# Patient Record
Sex: Female | Born: 1960
Health system: Southern US, Community
[De-identification: ages and names within clinical notes are randomized; demographics above are authoritative.]

## PROBLEM LIST (undated history)

## (undated) DIAGNOSIS — I2510 Atherosclerotic heart disease of native coronary artery without angina pectoris: Secondary | ICD-10-CM

## (undated) DIAGNOSIS — N83209 Unspecified ovarian cyst, unspecified side: Secondary | ICD-10-CM

## (undated) DIAGNOSIS — K449 Diaphragmatic hernia without obstruction or gangrene: Secondary | ICD-10-CM

## (undated) HISTORY — PX: TUBAL LIGATION: SHX77

## (undated) HISTORY — PX: THYROID SURGERY: SHX805

## (undated) HISTORY — PX: CHOLECYSTECTOMY: SHX55

---

## 1987-03-31 HISTORY — PX: CYSTOSCOPY WITH STENT PLACEMENT: SHX5790

## 1997-09-21 ENCOUNTER — Ambulatory Visit (HOSPITAL_COMMUNITY): Admission: RE | Admit: 1997-09-21 | Discharge: 1997-09-21 | Payer: Self-pay | Admitting: Surgery

## 1997-09-28 ENCOUNTER — Ambulatory Visit (HOSPITAL_COMMUNITY): Admission: RE | Admit: 1997-09-28 | Discharge: 1997-09-28 | Payer: Self-pay | Admitting: Surgery

## 1997-10-11 ENCOUNTER — Observation Stay (HOSPITAL_COMMUNITY): Admission: RE | Admit: 1997-10-11 | Discharge: 1997-10-12 | Payer: Self-pay | Admitting: Surgery

## 1999-06-02 ENCOUNTER — Other Ambulatory Visit: Admission: RE | Admit: 1999-06-02 | Discharge: 1999-06-02 | Payer: Self-pay | Admitting: Obstetrics and Gynecology

## 1999-12-03 ENCOUNTER — Other Ambulatory Visit: Admission: RE | Admit: 1999-12-03 | Discharge: 1999-12-03 | Payer: Self-pay | Admitting: Obstetrics and Gynecology

## 2000-07-22 ENCOUNTER — Other Ambulatory Visit: Admission: RE | Admit: 2000-07-22 | Discharge: 2000-07-22 | Payer: Self-pay | Admitting: Obstetrics and Gynecology

## 2001-06-20 ENCOUNTER — Encounter: Payer: Self-pay | Admitting: Obstetrics and Gynecology

## 2001-06-20 ENCOUNTER — Ambulatory Visit (HOSPITAL_COMMUNITY): Admission: RE | Admit: 2001-06-20 | Discharge: 2001-06-20 | Payer: Self-pay | Admitting: Obstetrics and Gynecology

## 2001-08-02 ENCOUNTER — Other Ambulatory Visit: Admission: RE | Admit: 2001-08-02 | Discharge: 2001-08-02 | Payer: Self-pay | Admitting: Obstetrics and Gynecology

## 2002-07-03 ENCOUNTER — Encounter: Payer: Self-pay | Admitting: Obstetrics and Gynecology

## 2002-07-03 ENCOUNTER — Ambulatory Visit (HOSPITAL_COMMUNITY): Admission: RE | Admit: 2002-07-03 | Discharge: 2002-07-03 | Payer: Self-pay | Admitting: Obstetrics and Gynecology

## 2002-09-05 ENCOUNTER — Other Ambulatory Visit: Admission: RE | Admit: 2002-09-05 | Discharge: 2002-09-05 | Payer: Self-pay | Admitting: Obstetrics and Gynecology

## 2003-07-27 ENCOUNTER — Ambulatory Visit (HOSPITAL_COMMUNITY): Admission: RE | Admit: 2003-07-27 | Discharge: 2003-07-27 | Payer: Self-pay | Admitting: Obstetrics and Gynecology

## 2003-10-05 ENCOUNTER — Other Ambulatory Visit: Admission: RE | Admit: 2003-10-05 | Discharge: 2003-10-05 | Payer: Self-pay | Admitting: Obstetrics and Gynecology

## 2004-03-07 ENCOUNTER — Ambulatory Visit (HOSPITAL_COMMUNITY): Admission: RE | Admit: 2004-03-07 | Discharge: 2004-03-07 | Payer: Self-pay | Admitting: Diagnostic Radiology

## 2004-04-07 ENCOUNTER — Other Ambulatory Visit: Admission: RE | Admit: 2004-04-07 | Discharge: 2004-04-07 | Payer: Self-pay | Admitting: Obstetrics and Gynecology

## 2004-12-11 ENCOUNTER — Other Ambulatory Visit: Admission: RE | Admit: 2004-12-11 | Discharge: 2004-12-11 | Payer: Self-pay | Admitting: Obstetrics and Gynecology

## 2004-12-25 ENCOUNTER — Ambulatory Visit (HOSPITAL_COMMUNITY): Admission: RE | Admit: 2004-12-25 | Discharge: 2004-12-25 | Payer: Self-pay | Admitting: Obstetrics and Gynecology

## 2004-12-29 ENCOUNTER — Encounter: Admission: RE | Admit: 2004-12-29 | Discharge: 2004-12-29 | Payer: Self-pay | Admitting: Obstetrics and Gynecology

## 2004-12-30 ENCOUNTER — Encounter: Admission: RE | Admit: 2004-12-30 | Discharge: 2004-12-30 | Payer: Self-pay | Admitting: Obstetrics and Gynecology

## 2005-03-12 ENCOUNTER — Emergency Department (HOSPITAL_COMMUNITY): Admission: EM | Admit: 2005-03-12 | Discharge: 2005-03-12 | Payer: Self-pay | Admitting: Emergency Medicine

## 2005-07-16 ENCOUNTER — Encounter: Admission: RE | Admit: 2005-07-16 | Discharge: 2005-07-16 | Payer: Self-pay | Admitting: Obstetrics and Gynecology

## 2006-01-07 ENCOUNTER — Encounter: Admission: RE | Admit: 2006-01-07 | Discharge: 2006-01-07 | Payer: Self-pay | Admitting: Obstetrics and Gynecology

## 2007-01-10 ENCOUNTER — Encounter: Admission: RE | Admit: 2007-01-10 | Discharge: 2007-01-10 | Payer: Self-pay | Admitting: Obstetrics and Gynecology

## 2008-01-11 ENCOUNTER — Ambulatory Visit (HOSPITAL_COMMUNITY): Admission: RE | Admit: 2008-01-11 | Discharge: 2008-01-11 | Payer: Self-pay | Admitting: Obstetrics and Gynecology

## 2009-01-11 ENCOUNTER — Ambulatory Visit (HOSPITAL_COMMUNITY): Admission: RE | Admit: 2009-01-11 | Discharge: 2009-01-11 | Payer: Self-pay | Admitting: Obstetrics and Gynecology

## 2009-11-12 ENCOUNTER — Encounter: Admission: RE | Admit: 2009-11-12 | Discharge: 2009-12-25 | Payer: Self-pay | Admitting: Orthopedic Surgery

## 2010-01-13 ENCOUNTER — Ambulatory Visit (HOSPITAL_COMMUNITY): Admission: RE | Admit: 2010-01-13 | Discharge: 2010-01-13 | Payer: Self-pay | Admitting: Obstetrics and Gynecology

## 2011-01-01 ENCOUNTER — Other Ambulatory Visit (HOSPITAL_COMMUNITY): Payer: Self-pay | Admitting: Obstetrics and Gynecology

## 2011-01-01 DIAGNOSIS — Z139 Encounter for screening, unspecified: Secondary | ICD-10-CM

## 2011-01-15 ENCOUNTER — Emergency Department (HOSPITAL_COMMUNITY): Payer: 59

## 2011-01-15 ENCOUNTER — Other Ambulatory Visit: Payer: Self-pay

## 2011-01-15 ENCOUNTER — Observation Stay (HOSPITAL_COMMUNITY)
Admission: EM | Admit: 2011-01-15 | Discharge: 2011-01-16 | Disposition: A | Discharge status: Home / Self Care | Payer: 59 | Attending: Internal Medicine | Admitting: Internal Medicine

## 2011-01-15 DIAGNOSIS — R079 Chest pain, unspecified: Secondary | ICD-10-CM

## 2011-01-15 DIAGNOSIS — I498 Other specified cardiac arrhythmias: Secondary | ICD-10-CM | POA: Insufficient documentation

## 2011-01-15 DIAGNOSIS — R112 Nausea with vomiting, unspecified: Secondary | ICD-10-CM | POA: Insufficient documentation

## 2011-01-15 DIAGNOSIS — R0789 Other chest pain: Principal | ICD-10-CM | POA: Insufficient documentation

## 2011-01-15 DIAGNOSIS — R1110 Vomiting, unspecified: Secondary | ICD-10-CM

## 2011-01-15 DIAGNOSIS — K219 Gastro-esophageal reflux disease without esophagitis: Secondary | ICD-10-CM | POA: Insufficient documentation

## 2011-01-15 DIAGNOSIS — R12 Heartburn: Secondary | ICD-10-CM

## 2011-01-15 DIAGNOSIS — R001 Bradycardia, unspecified: Secondary | ICD-10-CM | POA: Diagnosis present

## 2011-01-15 DIAGNOSIS — E876 Hypokalemia: Secondary | ICD-10-CM | POA: Diagnosis present

## 2011-01-15 LAB — BASIC METABOLIC PANEL
BUN: 17 mg/dL (ref 6–23)
CO2: 27 mEq/L (ref 19–32)
Calcium: 8.9 mg/dL (ref 8.4–10.5)
Chloride: 104 mEq/L (ref 96–112)
Creatinine, Ser: 0.68 mg/dL (ref 0.50–1.10)
GFR calc Af Amer: >90 mL/min (ref >90)
GFR calc non Af Amer: >90 mL/min (ref >90)
Glucose, Bld: 154 mg/dL — ABNORMAL HIGH (ref 70–99)
Potassium: 3.3 mEq/L — ABNORMAL LOW (ref 3.5–5.1)
Sodium: 141 mEq/L (ref 135–145)

## 2011-01-15 LAB — CBC
HCT: 36 % (ref 36.0–46.0)
Hemoglobin: 12.1 g/dL (ref 12.0–15.0)
MCH: 30.5 pg (ref 26.0–34.0)
MCHC: 33.6 g/dL (ref 30.0–36.0)
MCV: 90.7 fL (ref 78.0–100.0)
Platelets: 263 10*3/uL (ref 150–400)
RBC: 3.97 MIL/uL (ref 3.87–5.11)
RDW: 12 % (ref 11.5–15.5)
WBC: 8.2 10*3/uL (ref 4.0–10.5)

## 2011-01-15 LAB — CARDIAC PANEL(CRET KIN+CKTOT+MB+TROPI)
CK, MB: 2.2 ng/mL (ref 0.3–4.0)
Relative Index: INVALID (ref 0.0–2.5)
Total CK: 58 U/L (ref 7–177)
Troponin I: <0.3 ng/mL (ref <0.30)

## 2011-01-15 LAB — LIPASE, BLOOD: Lipase: 68 U/L — ABNORMAL HIGH (ref 11–59)

## 2011-01-15 LAB — POCT I-STAT TROPONIN I: Troponin i, poc: 0 ng/mL (ref 0.00–0.08)

## 2011-01-15 LAB — MAGNESIUM: Magnesium: 1.9 mg/dL (ref 1.5–2.5)

## 2011-01-15 MED ORDER — MORPHINE SULFATE 10 MG/ML IJ SOLN
INTRAMUSCULAR | Status: AC
  Administered 2011-01-15: 6 mg
  Filled 2011-01-15: qty 1

## 2011-01-15 MED ORDER — ONDANSETRON HCL 4 MG/2ML IJ SOLN: 4.0000 mg | Freq: Four times a day (QID) | INTRAMUSCULAR | Status: DC | PRN

## 2011-01-15 MED ORDER — POTASSIUM CHLORIDE IN NACL 40-0.9 MEQ/L-% IV SOLN
INTRAVENOUS | Status: AC
  Filled 2011-01-15: qty 1000

## 2011-01-15 MED ORDER — ACETAMINOPHEN 325 MG PO TABS: 650.0000 mg | ORAL_TABLET | Freq: Four times a day (QID) | ORAL | Status: DC | PRN

## 2011-01-15 MED ORDER — POTASSIUM CHLORIDE CRYS ER 20 MEQ PO TBCR
20.0000 meq | EXTENDED_RELEASE_TABLET | Freq: Once | ORAL | Status: AC
  Administered 2011-01-15: 20 meq via ORAL
  Filled 2011-01-15: qty 1

## 2011-01-15 MED ORDER — HYDROMORPHONE HCL 1 MG/ML IJ SOLN: 0.5000 mg | INTRAMUSCULAR | Status: DC | PRN

## 2011-01-15 MED ORDER — SODIUM CHLORIDE 0.9 % IJ SOLN
INTRAMUSCULAR | Status: AC
  Administered 2011-01-15: 21:00:00
  Filled 2011-01-15: qty 3

## 2011-01-15 MED ORDER — ONDANSETRON HCL 4 MG PO TABS: 4.0000 mg | ORAL_TABLET | Freq: Four times a day (QID) | ORAL | Status: DC | PRN

## 2011-01-15 MED ORDER — ENOXAPARIN SODIUM 40 MG/0.4ML ~~LOC~~ SOLN
40.0000 mg | SUBCUTANEOUS | Status: DC
  Administered 2011-01-15: 40 mg via SUBCUTANEOUS
  Filled 2011-01-15: qty 0.4

## 2011-01-15 MED ORDER — ONDANSETRON HCL 4 MG/2ML IJ SOLN
4.0000 mg | Freq: Once | INTRAMUSCULAR | Status: AC
  Administered 2011-01-15: 4 mg via INTRAVENOUS
  Filled 2011-01-15: qty 2

## 2011-01-15 MED ORDER — ACETAMINOPHEN 650 MG RE SUPP: 650.0000 mg | Freq: Four times a day (QID) | RECTAL | Status: DC | PRN

## 2011-01-15 MED ORDER — POTASSIUM CHLORIDE 10 MEQ/100ML IV SOLN
10.0000 meq | INTRAVENOUS | Status: AC
  Administered 2011-01-15 (×2): 10 meq via INTRAVENOUS
  Filled 2011-01-15: qty 100

## 2011-01-15 MED ORDER — ALUM & MAG HYDROXIDE-SIMETH 200-200-20 MG/5ML PO SUSP: 30.0000 mL | Freq: Four times a day (QID) | ORAL | Status: DC | PRN

## 2011-01-15 MED ORDER — PANTOPRAZOLE SODIUM 40 MG IV SOLR
40.0000 mg | Freq: Two times a day (BID) | INTRAVENOUS | Status: DC
  Administered 2011-01-15 – 2011-01-16 (×2): 40 mg via INTRAVENOUS
  Filled 2011-01-15 (×2): qty 40

## 2011-01-15 MED ORDER — SODIUM CHLORIDE 0.9 % IJ SOLN
INTRAMUSCULAR | Status: AC
  Administered 2011-01-15: 10 mL
  Filled 2011-01-15: qty 10

## 2011-01-15 MED ORDER — IOHEXOL 350 MG/ML SOLN
100.0000 mL | Freq: Once | INTRAVENOUS | Status: AC | PRN
  Administered 2011-01-15: 100 mL via INTRAVENOUS

## 2011-01-15 MED ORDER — POTASSIUM CHLORIDE IN NACL 40-0.9 MEQ/L-% IV SOLN
INTRAVENOUS | Status: DC
  Administered 2011-01-15 – 2011-01-16 (×2): via INTRAVENOUS
  Filled 2011-01-15 (×3): qty 1000

## 2011-01-15 MED ORDER — TRAZODONE HCL 50 MG PO TABS: 50.0000 mg | ORAL_TABLET | Freq: Every evening | ORAL | Status: DC | PRN

## 2011-01-15 NOTE — ED Notes (Signed)
Pt reports was working 3rd shift last night and started having pain in chest.  Says felt like heart burn.  Reports pain went away.  Pt woke up around 1130 this am with sqeezing pain in center of chest radiating into back.  Took 324mg  chewable asa and called ems.  Pt had 3 sprays of nitro and pain went from 10 to a 5.  Pain currently at 7.

## 2011-01-15 NOTE — ED Notes (Signed)
istat troponin initiated at this time.

## 2011-01-15 NOTE — H&P (Signed)
Hospital Admission Note Date: 01/15/2011  Patient name: Heather Arnold Medical record number: [id number] Date of birth: 10/26/1960 Age: 50 y.o. Gender: female PCP: Colette Ribas, MD  Attending physician: Christiane Ha  Chief Complaint: Chest pain.  History of Present Illness: Heather Arnold is an 50 y.o. female who presents with sudden severe substernal chest squeezing. It radiated to her back. It was unrelieved by nitroglycerin. Morphine helped, but now it feels just like a "ache". She had heartburn last night at work. This morning she felt nauseated. She is still nauseated. She had an episode of emesis after receiving morphine. The pain was not positional. She was doubled over in pain according to ED physician when she arrived. She has no past medical history. She had no other associated symptoms. She has never had a cardiac workup. Her EKG and cardiac enzymes were normal. She had a CT angiogram of the chest that was unremarkable.  Meds: None  Allergies: Review of patient's allergies indicates no known allergies.  Social history: Patient does not drink smoke or use drugs. 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 She is here with her significant other.  Family history: Patient's father had an MI at age 86. He also has diabetes and kidney cancer  Past Surgical History  Procedure Date  . Cholecystectomy   . Thyroid surgery    Review of Systems: Systems reviewed. As above otherwise negative.  Physical Exam: Blood pressure 125/69, pulse 66, temperature 97.6 F (36.4 C), temperature source Oral, resp. rate 18, height 5\' 2"  (1.575 m), weight 49.896 kg (110 lb), SpO2 100.00%. BP 125/69  Pulse 66  Temp(Src) 97.6 F (36.4 C) (Oral)  Resp 18  Ht 5\' 2"  (1.575 m)  Wt 49.896 kg (110 lb)  BMI 20.12 kg/m2  SpO2 100%  General Appearance:    Alert, cooperative, grasping emesis bag   Head:    Normocephalic, without obvious abnormality, atraumatic  Eyes:    PERRL,  conjunctiva/corneas clear, EOM's intact, fundi    benign, both eyes     Nose:   Nares normal, septum midline, mucosa normal, no drainage    or sinus tenderness  Throat:   slightly dry mucous membranes   Neck:   Supple, symmetrical, trachea midline, no adenopathy;    thyroid:  no enlargement/tenderness/nodules; no carotid   bruit or JVD  Back:     Symmetric, no curvature, ROM normal, no CVA tenderness  Lungs:     Clear to auscultation bilaterally, respirations unlabored  Chest Wall:    No tenderness or deformity   Heart:    Regular rate and rhythm, S1 and S2 normal, no murmur, rub   or gallop  Breast Exam:    No tenderness, masses, or nipple abnormality  Abdomen:     Soft, non-tender, bowel sounds active all four quadrants,    no masses, no organomegaly  Genitalia:   deferred   Rectal:   deferred   Extremities:   Extremities normal, atraumatic, no cyanosis or edema  Pulses:   2+ and symmetric all extremities  Skin:   Skin color, texture, turgor normal, no rashes or lesions  Lymph nodes:   Cervical, supraclavicular, and axillary nodes normal  Neurologic:   CNII-XII intact, normal strength, sensation and reflexes    throughout    Psychiatric: Normal affect  Lab results: Basic Metabolic Panel:  Basename 01/15/11 1245  NA 141  K 3.3*  CL 104  CO2 27  GLUCOSE 154*  BUN 17  CREATININE  0.68  CALCIUM 8.9  MG --  PHOS --   Liver Function Tests: No results found for this basename: AST:2,ALT:2,ALKPHOS:2,BILITOT:2,PROT:2,ALBUMIN:2 in the last 72 hours No results found for this basename: LIPASE:2,AMYLASE:2 in the last 72 hours No results found for this basename: AMMONIA:2 in the last 72 hours CBC:  Basename 01/15/11 1245  WBC 8.2  NEUTROABS --  HGB 12.1  HCT 36.0  MCV 90.7  PLT 263   Cardiac Enzymes:  Basename 01/15/11 1249  CKTOTAL 58  CKMB 2.2  CKMBINDEX --  TROPONINI <0.30   BNP: No results found for this basename: POCBNP:3 in the last 72 hours D-Dimer: No  results found for this basename: DDIMER:2 in the last 72 hours CBG: No results found for this basename: GLUCAP:6 in the last 72 hours Hemoglobin A1C: No results found for this basename: HGBA1C in the last 72 hours Fasting Lipid Panel: No results found for this basename: CHOL,HDL,LDLCALC,TRIG,CHOLHDL,LDLDIRECT in the last 72 hours Thyroid Function Tests: No results found for this basename: TSH,T4TOTAL,FREET4,T3FREE,THYROIDAB in the last 72 hours Anemia Panel: No results found for this basename: VITAMINB12,FOLATE,FERRITIN,TIBC,IRON,RETICCTPCT in the last 72 hours  Imaging results:  Ct Angio Chest W/cm &/or Wo Cm  01/15/2011  *RADIOLOGY REPORT*  Clinical Data:  Chest pain.  CT ANGIOGRAPHY CHEST WITH CONTRAST  Technique:  Multidetector CT imaging of the chest was performed using the standard protocol during bolus administration of intravenous contrast.  Multiplanar CT image reconstructions including MIPs were obtained to evaluate the vascular anatomy.  Contrast: OMNIPAQUE IOHEXOL 350 MG/ML IV SOLN  Comparison:  None  Findings:  The chest wall is unremarkable.  A small scattered supraclavicular lymph nodes but no adenopathy.  No axillary adenopathy.  The only the left thyroid lobe is identified. Probable prior right thyroidectomy.  The bony thorax is intact.  No destructive bony lesions or spinal canal compromise.  The heart is normal in size.  No pericardial effusion.  No mediastinal or hilar lymphadenopathy.  The esophagus is grossly normal.  There is a small to moderate sized hiatal hernia noted. The aorta is normal in caliber.  No dissection.  The major branch vessels are patent.  The pulmonary arterial tree is well opacified.  No filling defects to suggest pulmonary emboli.  Examination of the lung parenchyma demonstrates no acute pulmonary findings.  No pleural effusion.  No worrisome pulmonary nodules or masses.  The tracheobronchial tree is unremarkable.  The upper abdomen is unremarkable.   Review of the MIP images confirms the above findings.  IMPRESSION:  1.  No CT findings for pulmonary embolism. 2.  Normal thoracic aorta. 3.  No acute pulmonary findings.  Original Report Authenticated By: P. Loralie Champagne, M.D.   Dg Chest Portable 1 View  01/15/2011  *RADIOLOGY REPORT*  Clinical Data: Chest pain  PORTABLE CHEST - 1 VIEW  Comparison: None  Findings: Normal heart size and normal vascularity.  Mild apical scarring bilaterally.  Negative for infiltrate effusion or mass lesion.  IMPRESSION: No active cardiopulmonary disease.  Original Report Authenticated By: Camelia Phenes, M.D.    Assessment & Plan: Principal Problem:  *Chest pain Active Problems:  Vomiting  Heartburn  hypokalemia  Patient's chest pain is most likely GI etiology. She is still nauseated. I will monitor her overnight. Give IV protonic, anti-emetics, IV fluids. Rule out MI. Replete potassium. If improved tomorrow, she can likely be discharged.  Donielle Kaigler L 01/15/2011, 4:25 PM

## 2011-01-15 NOTE — ED Provider Notes (Signed)
History   This chart was scribed for Raeford Razor, MD by Clarita Crane. The patient was seen in room APA19/APA19 and the patient's care was started at 12:43PM.   CSN: [id number] Arrival date & time: 01/15/2011 12:38 PM   First MD Initiated Contact with Patient 01/15/11 1247      Chief Complaint  Patient presents with  . Chest Pain   HPI Heather Arnold is a 50 y.o. female who presents to the Emergency Department complaining of moderate to severe chest pain described as aching which began last night while working with 1 episode lasting 3-4 hours before resolving and an additional reoccurrence of same chest pain which began 2 hours ago after awaking and has been constant and gradually worsening since with associated SOB, nausea, vomiting and back pain. Notes chest pain is aggravated by nothing and was relieved with administration of 3 NTG sprays provided by EMS en route. Denies fever.  History reviewed. No pertinent past medical history.  Past Surgical History  Procedure Date  . Cholecystectomy   . Thyroid surgery     No family history on file.  History  Substance Use Topics  . Smoking status: Not on file  . Smokeless tobacco: Not on file  . Alcohol Use:     OB History    Grav Para Term Preterm Abortions TAB SAB Ect Mult Living                  Review of Systems 10 Systems reviewed and are negative for acute change except as noted in the HPI.  Allergies  Review of patient's allergies indicates no known allergies.  Home Medications  No current outpatient prescriptions on file.  BP 123/55  Pulse 63  Temp(Src) 97.6 F (36.4 C) (Oral)  Resp 16  Ht 5\' 2"  (1.575 m)  Wt 110 lb (49.896 kg)  BMI 20.12 kg/m2  SpO2 97%  Physical Exam  Nursing note and vitals reviewed. Constitutional: She is oriented to person, place, and time. She appears well-developed and well-nourished.       Uncomfortable appearing. Actively vomiting during exam.   HENT:  Head: Normocephalic and  atraumatic.  Eyes: EOM are normal. Pupils are equal, round, and reactive to light.  Neck: Neck supple. No tracheal deviation present.  Cardiovascular: Regular rhythm.  Tachycardia present.   No murmur heard. Pulmonary/Chest: Effort normal. No respiratory distress. She has no wheezes. She has no rales.  Abdominal: Soft. She exhibits no distension. There is no tenderness.  Musculoskeletal: Normal range of motion. She exhibits no edema.  Neurological: She is alert and oriented to person, place, and time. No sensory deficit.  Skin: Skin is warm and dry.  Psychiatric: Her behavior is normal.    ED Course  Procedures (including critical care time)  DIAGNOSTIC STUDIES:   COORDINATION OF CARE:    Labs Reviewed  BASIC METABOLIC PANEL - Abnormal; Notable for the following:    Potassium 3.3 (*)    Glucose, Bld 154 (*)    All other components within normal limits  CBC  CARDIAC PANEL(CRET KIN+CKTOT+MB+TROPI)  POCT I-STAT TROPONIN I  I-STAT TROPONIN I   Ct Angio Chest W/cm &/or Wo Cm  01/15/2011  *RADIOLOGY REPORT*  Clinical Data:  Chest pain.  CT ANGIOGRAPHY CHEST WITH CONTRAST  Technique:  Multidetector CT imaging of the chest was performed using the standard protocol during bolus administration of intravenous contrast.  Multiplanar CT image reconstructions including MIPs were obtained to evaluate the vascular anatomy.  Contrast:  OMNIPAQUE IOHEXOL 350 MG/ML IV SOLN  Comparison:  None  Findings:  The chest wall is unremarkable.  A small scattered supraclavicular lymph nodes but no adenopathy.  No axillary adenopathy.  The only the left thyroid lobe is identified. Probable prior right thyroidectomy.  The bony thorax is intact.  No destructive bony lesions or spinal canal compromise.  The heart is normal in size.  No pericardial effusion.  No mediastinal or hilar lymphadenopathy.  The esophagus is grossly normal.  There is a small to moderate sized hiatal hernia noted. The aorta is normal in  caliber.  No dissection.  The major branch vessels are patent.  The pulmonary arterial tree is well opacified.  No filling defects to suggest pulmonary emboli.  Examination of the lung parenchyma demonstrates no acute pulmonary findings.  No pleural effusion.  No worrisome pulmonary nodules or masses.  The tracheobronchial tree is unremarkable.  The upper abdomen is unremarkable.  Review of the MIP images confirms the above findings.  IMPRESSION:  1.  No CT findings for pulmonary embolism. 2.  Normal thoracic aorta. 3.  No acute pulmonary findings.  Original Report Authenticated By: P. Loralie Champagne, M.D.   Dg Chest Portable 1 View  01/15/2011  *RADIOLOGY REPORT*  Clinical Data: Chest pain  PORTABLE CHEST - 1 VIEW  Comparison: None  Findings: Normal heart size and normal vascularity.  Mild apical scarring bilaterally.  Negative for infiltrate effusion or mass lesion.  IMPRESSION: No active cardiopulmonary disease.  Original Report Authenticated By: Camelia Phenes, M.D.   EKG:  Rhythm:normal sinus Rate: 72 Axis: normal Intervals:normal ST segments: Normal Comparison: none  1. Chest pain   2. Hypokalemia   3. Vomiting   4. Heartburn       MDM  50yF with CP. Initial w/u relatively unremarkable.  Symptoms concerning though, particularly repeat episodes and relief with nitro. Do not have convincing alternative diagnosis at this time. Pt does not many significant risk factors for ACS aside from age, but given repeat episodes which could be unstable angina will admit for further eval.     I personally preformed the services scribed in my presence. The recorded information has been reviewed and considered. Raeford Razor, MD.    Raeford Razor, MD 01/20/11 [phone]

## 2011-01-15 NOTE — ED Notes (Signed)
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  Reports was working 3rd shift last night and started having an achy feeling in chest.  Says thought was indigestion.  Reports pain got better.  Pt went to sleep and woke up around 1130 this am with severe squeezing pain in center of chest radiating through to her back.  Pt nauseated, dry heaving.  Unable to sit still upon arrival to ED.  EDP was notified and morphine administered.  Pt more comforatble at this time.  ECG was performed and showed to edp.

## 2011-01-16 ENCOUNTER — Encounter (HOSPITAL_COMMUNITY): Payer: Self-pay

## 2011-01-16 DIAGNOSIS — R001 Bradycardia, unspecified: Secondary | ICD-10-CM | POA: Diagnosis present

## 2011-01-16 DIAGNOSIS — E876 Hypokalemia: Secondary | ICD-10-CM | POA: Diagnosis present

## 2011-01-16 LAB — BASIC METABOLIC PANEL
BUN: 11 mg/dL (ref 6–23)
GFR calc Af Amer: >90 mL/min (ref >90)
GFR calc non Af Amer: >90 mL/min (ref >90)
Potassium: 4 mEq/L (ref 3.5–5.1)

## 2011-01-16 LAB — CARDIAC PANEL(CRET KIN+CKTOT+MB+TROPI)
CK, MB: 1.9 ng/mL (ref 0.3–4.0)
CK, MB: 2.1 ng/mL (ref 0.3–4.0)
Troponin I: <0.3 ng/mL (ref <0.30)

## 2011-01-16 MED ORDER — ACETAMINOPHEN 325 MG PO TABS: 650.0000 mg | ORAL_TABLET | Freq: Four times a day (QID) | ORAL | Status: AC | PRN

## 2011-01-16 MED ORDER — PANTOPRAZOLE SODIUM 40 MG PO TBEC: 40.0000 mg | DELAYED_RELEASE_TABLET | Freq: Every day | ORAL | Status: DC

## 2011-01-16 MED ORDER — ONDANSETRON HCL 4 MG PO TABS: 4.0000 mg | ORAL_TABLET | Freq: Four times a day (QID) | ORAL | Status: AC | PRN

## 2011-01-16 MED ORDER — SODIUM CHLORIDE 0.9 % IJ SOLN
INTRAMUSCULAR | Status: AC
  Administered 2011-01-16: 10 mL
  Filled 2011-01-16: qty 10

## 2011-01-16 NOTE — Discharge Summary (Signed)
Physician Discharge Summary  HAFSAH HENDLER MRN: [id number] DOB/AGE: [birth year] 50 y.o.  PCP: Colette Ribas, MD   Admit date: 01/15/2011 Discharge date: 01/16/2011  Discharge Diagnoses:  1. Chest pain, possibly secondary to GERD. MI was ruled out. 2. Nausea and vomiting, secondary to GERD, resolved. 3. Transient bradycardia. 4. Hypokalemia.    Current Discharge Medication List    START taking these medications   Details  acetaminophen (TYLENOL) 325 MG tablet Take 2 tablets (650 mg total) by mouth every 6 (six) hours as needed (or Fever >/= 101). Qty: 30 tablet    ondansetron (ZOFRAN) 4 MG tablet Take 1 tablet (4 mg total) by mouth every 6 (six) hours as needed for nausea. Qty: 20 tablet, Refills: 0    pantoprazole (PROTONIX) 40 MG tablet Take 1 tablet (40 mg total) by mouth daily. Qty: 30 tablet, Refills: 2        Discharge Condition: Improved.  Disposition: Home.    Consults: None   Significant Diagnostic Studies: Ct Angio Chest W/cm &/or Wo Cm  01/15/2011  *RADIOLOGY REPORT*  Clinical Data:  Chest pain.  CT ANGIOGRAPHY CHEST WITH CONTRAST  Technique:  Multidetector CT imaging of the chest was performed using the standard protocol during bolus administration of intravenous contrast.  Multiplanar CT image reconstructions including MIPs were obtained to evaluate the vascular anatomy.  Contrast: OMNIPAQUE IOHEXOL 350 MG/ML IV SOLN  Comparison:  None  Findings:  The chest wall is unremarkable.  A small scattered supraclavicular lymph nodes but no adenopathy.  No axillary adenopathy.  The only the left thyroid lobe is identified. Probable prior right thyroidectomy.  The bony thorax is intact.  No destructive bony lesions or spinal canal compromise.  The heart is normal in size.  No pericardial effusion.  No mediastinal or hilar lymphadenopathy.  The esophagus is grossly normal.  There is a small to moderate sized hiatal hernia noted. The aorta is normal in  caliber.  No dissection.  The major branch vessels are patent.  The pulmonary arterial tree is well opacified.  No filling defects to suggest pulmonary emboli.  Examination of the lung parenchyma demonstrates no acute pulmonary findings.  No pleural effusion.  No worrisome pulmonary nodules or masses.  The tracheobronchial tree is unremarkable.  The upper abdomen is unremarkable.  Review of the MIP images confirms the above findings.  IMPRESSION:  1.  No CT findings for pulmonary embolism. 2.  Normal thoracic aorta. 3.  No acute pulmonary findings.  Original Report Authenticated By: P. Loralie Champagne, M.D.   Dg Chest Portable 1 View  01/15/2011  *RADIOLOGY REPORT*  Clinical Data: Chest pain  PORTABLE CHEST - 1 VIEW  Comparison: None  Findings: Normal heart size and normal vascularity.  Mild apical scarring bilaterally.  Negative for infiltrate effusion or mass lesion.  IMPRESSION: No active cardiopulmonary disease.  Original Report Authenticated By: Camelia Phenes, M.D.      Microbiology: No results found for this or any previous visit (from the past 240 hour(s)).   Labs: Results for orders placed during the hospital encounter of 01/15/11 (from the past 48 hour(s))  CBC     Status: Normal   Collection Time   01/15/11 12:45 PM      Component Value Range Comment   WBC 8.2  4.0 - 10.5 (K/uL)    RBC 3.97  3.87 - 5.11 (MIL/uL)    Hemoglobin 12.1  12.0 - 15.0 (g/dL)    HCT 40.9  81.1 -  46.0 (%)    MCV 90.7  78.0 - 100.0 (fL)    MCH 30.5  26.0 - 34.0 (pg)    MCHC 33.6  30.0 - 36.0 (g/dL)    RDW 16.1  09.6 - 04.5 (%)    Platelets 263  150 - 400 (K/uL)   BASIC METABOLIC PANEL     Status: Abnormal   Collection Time   01/15/11 12:45 PM      Component Value Range Comment   Sodium 141  135 - 145 (mEq/L)    Potassium 3.3 (*) 3.5 - 5.1 (mEq/L)    Chloride 104  96 - 112 (mEq/L)    CO2 27  19 - 32 (mEq/L)    Glucose, Bld 154 (*) 70 - 99 (mg/dL)    BUN 17  6 - 23 (mg/dL)    Creatinine, Ser 4.09   0.50 - 1.10 (mg/dL)    Calcium 8.9  8.4 - 10.5 (mg/dL)    GFR calc non Af Amer >90  >90 (mL/min)    GFR calc Af Amer >90  >90 (mL/min)   MAGNESIUM     Status: Normal   Collection Time   01/15/11 12:45 PM      Component Value Range Comment   Magnesium 1.9  1.5 - 2.5 (mg/dL)   LIPASE, BLOOD     Status: Abnormal   Collection Time   01/15/11 12:45 PM      Component Value Range Comment   Lipase 68 (*) 11 - 59 (U/L)   CARDIAC PANEL(CRET KIN+CKTOT+MB+TROPI)     Status: Normal   Collection Time   01/15/11 12:49 PM      Component Value Range Comment   Total CK 58  7 - 177 (U/L)    CK, MB 2.2  0.3 - 4.0 (ng/mL)    Troponin I <0.30  <0.30 (ng/mL)    Relative Index RELATIVE INDEX IS INVALID  0.0 - 2.5    POCT I-STAT TROPONIN I     Status: Normal   Collection Time   01/15/11 12:51 PM      Component Value Range Comment   Troponin i, poc 0.00  0.00 - 0.08 (ng/mL)    Comment 3            CARDIAC PANEL(CRET KIN+CKTOT+MB+TROPI)     Status: Normal   Collection Time   01/15/11 11:38 PM      Component Value Range Comment   Total CK 45  7 - 177 (U/L)    CK, MB 2.1  0.3 - 4.0 (ng/mL)    Troponin I <0.30  <0.30 (ng/mL)    Relative Index RELATIVE INDEX IS INVALID  0.0 - 2.5    BASIC METABOLIC PANEL     Status: Abnormal   Collection Time   01/16/11  6:00 AM      Component Value Range Comment   Sodium 138  135 - 145 (mEq/L)    Potassium 4.0  3.5 - 5.1 (mEq/L) DELTA CHECK NOTED   Chloride 105  96 - 112 (mEq/L)    CO2 26  19 - 32 (mEq/L)    Glucose, Bld 102 (*) 70 - 99 (mg/dL)    BUN 11  6 - 23 (mg/dL)    Creatinine, Ser 8.11  0.50 - 1.10 (mg/dL)    Calcium 8.8  8.4 - 10.5 (mg/dL)    GFR calc non Af Amer >90  >90 (mL/min)    GFR calc Af Amer >90  >90 (mL/min)   CARDIAC PANEL(CRET  KIN+CKTOT+MB+TROPI)     Status: Normal   Collection Time   01/16/11  6:00 AM      Component Value Range Comment   Total CK 40  7 - 177 (U/L)    CK, MB 1.9  0.3 - 4.0 (ng/mL)    Troponin I <0.30  <0.30 (ng/mL)     Relative Index RELATIVE INDEX IS INVALID  0.0 - 2.5       HPI : The patient is a 50 year old woman with no significant past medical history, who presented to the emergency department on 01/15/2011 with a chief complaint of severe substernal chest pain, she described as squeezing. It was unrelieved by nitroglycerin. Morphine did help to relieve the pain, however, she vomited after being given morphine. During the initial evaluation, her EKG and initial cardiac enzymes were noted to be within normal limits. A CT angiogram of her chest was ordered and it revealed no PE and virtually no abnormalities. She was admitted for observation and evaluation.  HOSPITAL COURSE: The patient was started on supportive treatment. There was a high suspicion for gastroesophageal reflux or esophagitis. Therefore, IV Protonix was added IV twice a day. Zofran was ordered for nausea and vomiting. Potassium chloride was repleted in the IV fluids and orally. For further evaluation, cardiac enzymes were ordered. All of her cardiac enzymes were within normal limits, and therefore, she ruled out for myocardial infarction.  The next morning, following admission, she had no complaints of chest pain. Her serum potassium improved from 3.3-4.0. Given her symptomatic relief on Protonix, it was felt that her symptomatology was likely secondary to gastroesophageal reflux disease. The patient also reported some stress. 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 As much as possible, she was advised to avoid heavy lifting and stress. She was advised to avoid NSAIDs and use Tylenol as needed for pain. She was discharged on Protonix 40 mg daily.   Discharge Exam: Blood pressure 110/66, pulse 61, temperature 98.3 F (36.8 C), temperature source Oral, resp. rate 19, height 5\' 2"  (1.575 m), weight 51.211 kg (112 lb 14.4 oz), SpO2 96.00%. Lungs: Clear to  auscultation bilaterally. Heart: S1, S2, no murmurs rubs or gallops. Abdomen: All sounds, nontender, nondistended. Extremities: No pedal edema.    Discharge Orders    Future Appointments: Provider: Department: Dept Phone: Center:   01/19/2011 8:40 AM [facility] [phone] [facility]     Future Orders Please Complete By Expires   Diet - low sodium heart healthy      Diet general      Increase activity slowly      Discharge instructions      Comments:   Avoid stress and heavy lifting. Avoid NSAID medications. Take Protonix as prescribed. Call to follow up with Dr. Phillips Odor in 1-2 weeks.      Follow-up Information    Call GOLDING,JOHN CABOT. (Call to make an appointment to f/up in 1-2 weeks.)    Contact information:   [address] [place] [address] [place] [address] [phone]          Signed: Priyana Mccarey 01/16/2011, 10:02 AM

## 2011-01-16 NOTE — Progress Notes (Signed)
PIV removed without complaint, patient discharged home. Patient verbalizes understanding of discharge instructions, follow up appointments and prescriptions. Patient escorted out by staff, transported home by family.

## 2011-01-19 ENCOUNTER — Ambulatory Visit (HOSPITAL_COMMUNITY)
Admission: RE | Admit: 2011-01-19 | Discharge: 2011-01-19 | Disposition: A | Discharge status: Home / Self Care | Payer: 59 | Source: Ambulatory Visit | Attending: Obstetrics and Gynecology | Admitting: Obstetrics and Gynecology

## 2011-01-19 DIAGNOSIS — Z1231 Encounter for screening mammogram for malignant neoplasm of breast: Secondary | ICD-10-CM | POA: Insufficient documentation

## 2011-01-19 DIAGNOSIS — Z139 Encounter for screening, unspecified: Secondary | ICD-10-CM

## 2011-09-04 ENCOUNTER — Emergency Department (HOSPITAL_COMMUNITY)
Admission: EM | Admit: 2011-09-04 | Discharge: 2011-09-04 | Disposition: A | Discharge status: Home / Self Care | Payer: 59 | Attending: Emergency Medicine | Admitting: Emergency Medicine

## 2011-09-04 ENCOUNTER — Encounter (HOSPITAL_COMMUNITY): Payer: Self-pay | Admitting: *Deleted

## 2011-09-04 ENCOUNTER — Emergency Department (HOSPITAL_COMMUNITY): Payer: 59

## 2011-09-04 DIAGNOSIS — W010XXA Fall on same level from slipping, tripping and stumbling without subsequent striking against object, initial encounter: Secondary | ICD-10-CM | POA: Insufficient documentation

## 2011-09-04 DIAGNOSIS — S6390XA Sprain of unspecified part of unspecified wrist and hand, initial encounter: Secondary | ICD-10-CM | POA: Insufficient documentation

## 2011-09-04 DIAGNOSIS — S63601A Unspecified sprain of right thumb, initial encounter: Secondary | ICD-10-CM

## 2011-09-04 NOTE — ED Notes (Signed)
Pt was walking into her garage when her feet slipped and she fell landing on her right hand, pt c/o pain to right thumb area. Pt has swelling and bruising noted to right thumb area.

## 2011-09-04 NOTE — ED Notes (Signed)
Swelling and purple discoloration noted to right thumb. Pt slipped and fell and caught herself with her hand. Able to move thumb but causes pain. Alert and oriented x 3. Skin warm and dry. Color pink.

## 2011-09-04 NOTE — Discharge Instructions (Signed)
Finger Sprain A finger sprain is a tear in one of the strong, fibrous tissues that connect the bones (ligaments) in your finger. The severity of the sprain depends on how much of the ligament is torn. The tear can be either partial or complete. CAUSES  Often, sprains are a result of a fall or accident. If you extend your hands to catch an object or to protect yourself, the force of the impact causes the fibers of your ligament to stretch too much. This excess tension causes the fibers of your ligament to tear. SYMPTOMS  You may have some loss of motion in your finger. Other symptoms include:  Bruising.   Tenderness.   Swelling.  DIAGNOSIS  In order to diagnose finger sprain, your caregiver will physically examine your finger or thumb to determine how torn the ligament is. Your caregiver may also suggest an X-ray exam of your finger to make sure no bones are broken. TREATMENT  If your ligament is only partially torn, treatment usually involves keeping the finger in a fixed position (immobilization) for a short period. To do this, your caregiver will apply a bandage, cast, or splint to keep your finger from moving until it heals. For a partially torn ligament, the healing process usually takes 2 to 3 weeks. If your ligament is completely torn, you may need surgery to reconnect the ligament to the bone. After surgery a cast or splint will be applied and will need to stay on your finger or thumb for 4 to 6 weeks while your ligament heals. HOME CARE INSTRUCTIONS  Keep your injured finger elevated, when possible, to decrease swelling.   To ease pain and swelling, apply ice to your joint twice a day, for 2 to 3 days:   Put ice in a plastic bag.   Place a towel between your skin and the bag.   Leave the ice on for 15 minutes.   Only take over-the-counter or prescription medicine for pain as directed by your caregiver.   Do not wear rings on your injured finger.   Do not leave your finger  unprotected until pain and stiffness go away (usually 3 to 4 weeks).   Do not allow your cast or splint to get wet. Cover your cast or splint with a plastic bag when you shower or bathe. Do not swim.   Your caregiver may suggest special exercises for you to do during your recovery to prevent or limit permanent stiffness.  SEEK IMMEDIATE MEDICAL CARE IF:  Your cast or splint becomes damaged.   Your pain becomes worse rather than better.  MAKE SURE YOU:  Understand these instructions.   Will watch your condition.   Will get help right away if you are not doing well or get worse.  Document Released: 04/23/2004 Document Revised: 03/05/2011 Document Reviewed: 11/17/2010 [facility] Patient Information 2012 [place], [place].Cryotherapy Cryotherapy means treatment with cold. Ice or gel packs can be used to reduce both pain and swelling. Ice is the most helpful within the first 24 to 48 hours after an injury or flareup from overusing a muscle or joint. Sprains, strains, spasms, burning pain, shooting pain, and aches can all be eased with ice. Ice can also be used when recovering from surgery. Ice is effective, has very few side effects, and is safe for most people to use. PRECAUTIONS  Ice is not a safe treatment option for people with:  Raynaud's phenomenon. This is a condition affecting small blood vessels in the extremities. Exposure to  cold may cause your problems to return.   Cold hypersensitivity. There are many forms of cold hypersensitivity, including:   Cold urticaria. Red, itchy hives appear on the skin when the tissues begin to warm after being iced.   Cold erythema. This is a red, itchy rash caused by exposure to cold.   Cold hemoglobinuria. Red blood cells break down when the tissues begin to warm after being iced. The hemoglobin that carry oxygen are passed into the urine because they cannot combine with blood proteins fast enough.   Numbness or altered sensitivity in the area being  iced.  If you have any of the following conditions, do not use ice until you have discussed cryotherapy with your caregiver:  Heart conditions, such as arrhythmia, angina, or chronic heart disease.   High blood pressure.   Healing wounds or open skin in the area being iced.   Current infections.   Rheumatoid arthritis.   Poor circulation.   Diabetes.  Ice slows the blood flow in the region it is applied. This is beneficial when trying to stop inflamed tissues from spreading irritating chemicals to surrounding tissues. However, if you expose your skin to cold temperatures for too long or without the proper protection, you can damage your skin or nerves. Watch for signs of skin damage due to cold. HOME CARE INSTRUCTIONS Follow these tips to use ice and cold packs safely.  Place a dry or damp towel between the ice and skin. A damp towel will cool the skin more quickly, so you may need to shorten the time that the ice is used.   For a more rapid response, add gentle compression to the ice.   Ice for no more than 10 to 20 minutes at a time. The bonier the area you are icing, the less time it will take to get the benefits of ice.   Check your skin after 5 minutes to make sure there are no signs of a poor response to cold or skin damage.   Rest 20 minutes or more in between uses.   Once your skin is numb, you can end your treatment. You can test numbness by very lightly touching your skin. The touch should be so light that you do not see the skin dimple from the pressure of your fingertip. When using ice, most people will feel these normal sensations in this order: cold, burning, aching, and numbness.   Do not use ice on someone who cannot communicate their responses to pain, such as small children or people with dementia.  HOW TO MAKE AN ICE PACK Ice packs are the most common way to use ice therapy. Other methods include ice massage, ice baths, and cryo-sprays. Muscle creams that cause a  cold, tingly feeling do not offer the same benefits that ice offers and should not be used as a substitute unless recommended by your caregiver. To make an ice pack, do one of the following:  Place crushed ice or a bag of frozen vegetables in a sealable plastic bag. Squeeze out the excess air. Place this bag inside another plastic bag. Slide the bag into a pillowcase or place a damp towel between your skin and the bag.   Mix 3 parts water with 1 part rubbing alcohol. Freeze the mixture in a sealable plastic bag. When you remove the mixture from the freezer, it will be slushy. Squeeze out the excess air. Place this bag inside another plastic bag. Slide the bag into a pillowcase or  place a damp towel between your skin and the bag.  SEEK MEDICAL CARE IF:  You develop white spots on your skin. This may give the skin a blotchy (mottled) appearance.   Your skin turns blue or pale.   Your skin becomes waxy or hard.   Your swelling gets worse.  MAKE SURE YOU:   Understand these instructions.   Will watch your condition.   Will get help right away if you are not doing well or get worse.  Document Released: 11/10/2010 Document Revised: 03/05/2011 Document Reviewed: 11/10/2010 [facility] Patient Information 2012 [place], [place].   Wear the splint for comfort and stability.  Apply ice several times daily an elevate as much as possible.  Take ibuprofen up to 500 mg every 8 hrs with food.  Follow up with dr. Hilda Lias as needed.

## 2011-09-04 NOTE — ED Provider Notes (Signed)
History     CSN: [id number]  Arrival date & time 09/04/11  1339   None     Chief Complaint  Patient presents with  . Fall    (Consider location/radiation/quality/duration/timing/severity/associated sxs/prior treatment) HPI Comments: Wearing flip flops and slipped on garage floor.  Injured R thumb.  R hand dominant.  Patient is a 51 y.o. female presenting with fall. The history is provided by the patient. No language interpreter was used.  Fall She landed on concrete. The pain is moderate. She was ambulatory at the scene. There was entrapment after the fall.    History reviewed. No pertinent past medical history.  Past Surgical History  Procedure Date  . Cholecystectomy   . Thyroid surgery     No family history on file.  History  Substance Use Topics  . Smoking status: Never Smoker   . Smokeless tobacco: Never Used  . Alcohol Use: No    OB History    Grav Para Term Preterm Abortions TAB SAB Ect Mult Living                  Review of Systems  Musculoskeletal:       Thumb injury   All other systems reviewed and are negative.    Allergies  Review of patient's allergies indicates no known allergies.  Home Medications   Current Outpatient Rx  Name Route Sig Dispense Refill  . CETIRIZINE HCL 10 MG PO TABS Oral Take 10 mg by mouth daily.      BP 107/72  Pulse 57  Temp 97.5 F (36.4 C)  Resp 20  SpO2 98%  Physical Exam  Nursing note and vitals reviewed. Constitutional: She is oriented to person, place, and time. She appears well-developed and well-nourished. No distress.  HENT:  Head: Normocephalic and atraumatic.  Eyes: EOM are normal.  Neck: Normal range of motion.  Cardiovascular: Normal rate, regular rhythm and normal heart sounds.   Pulmonary/Chest: Effort normal and breath sounds normal.  Abdominal: Soft. She exhibits no distension. There is no tenderness.  Musculoskeletal: She exhibits tenderness.       Hands: Neurological: She is alert  and oriented to person, place, and time.  Skin: Skin is warm and dry.  Psychiatric: She has a normal mood and affect. Judgment normal.    ED Course  Procedures (including critical care time)  Labs Reviewed - No data to display Dg Finger Thumb Right  09/04/2011  *RADIOLOGY REPORT*  Clinical Data: Pain post fall  RIGHT THUMB 2+V  Comparison: None.  Findings: Three views of the right thumb submitted.  No acute fracture or subluxation.  No radiopaque foreign body.  IMPRESSION: No acute fracture or subluxation.  Original Report Authenticated By: Natasha Mead, M.D.     1. Sprain of right thumb       MDM  Thumb spica splint, ice elevation.  Ibuprofen.  F/u with dr. Hilda Lias prn.        Worthy Rancher, PA 09/04/11 [id number]

## 2011-09-04 NOTE — ED Provider Notes (Signed)
Medical screening examination/treatment/procedure(s) were performed by non-physician practitioner and as supervising physician I was immediately available for consultation/collaboration. Lashon Hillier, MD, FACEP   Imanol Bihl L Trexton Escamilla, MD 09/04/11 1646 

## 2011-12-31 ENCOUNTER — Other Ambulatory Visit (HOSPITAL_COMMUNITY): Payer: Self-pay | Admitting: Obstetrics and Gynecology

## 2011-12-31 DIAGNOSIS — Z139 Encounter for screening, unspecified: Secondary | ICD-10-CM

## 2012-01-21 ENCOUNTER — Ambulatory Visit (HOSPITAL_COMMUNITY)
Admission: RE | Admit: 2012-01-21 | Discharge: 2012-01-21 | Disposition: A | Discharge status: Home / Self Care | Payer: 59 | Source: Ambulatory Visit | Attending: Obstetrics and Gynecology | Admitting: Obstetrics and Gynecology

## 2012-01-21 DIAGNOSIS — Z1231 Encounter for screening mammogram for malignant neoplasm of breast: Secondary | ICD-10-CM | POA: Insufficient documentation

## 2012-01-21 DIAGNOSIS — Z139 Encounter for screening, unspecified: Secondary | ICD-10-CM

## 2012-09-11 IMAGING — CT CT ANGIO CHEST
1 of 6 series · 5 of 36 positions shown · IV contrast (Omnipaque 300)
Comparison: None

CLINICAL DATA: Chest pain.

CT ANGIOGRAPHY CHEST WITH CONTRAST
TECHNIQUE: Multidetector CT imaging of the chest was performed
using the standard protocol during bolus administration of
intravenous contrast.  Multiplanar CT image reconstructions
including MIPs were obtained to evaluate the vascular anatomy.
Contrast: 100mL OMNIPAQUE IOHEXOL 350 MG/ML IV SOLN

[Series 5: pe 3.0 b40f · axial · 0.56mm/px · z∈[-238,-66]mm · 5 of 87 slices shown]
[im 15/87  lung]
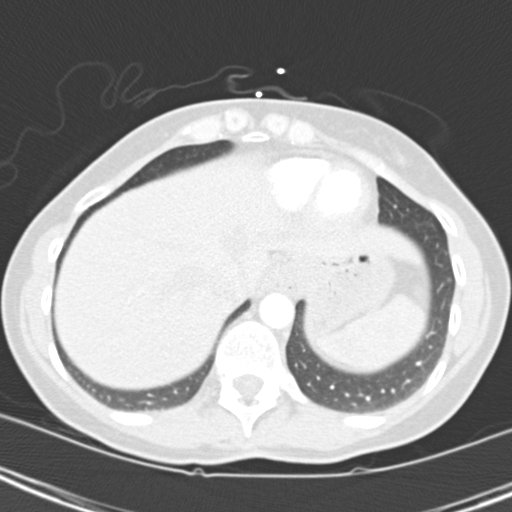
[im 29/87  mediastinal]
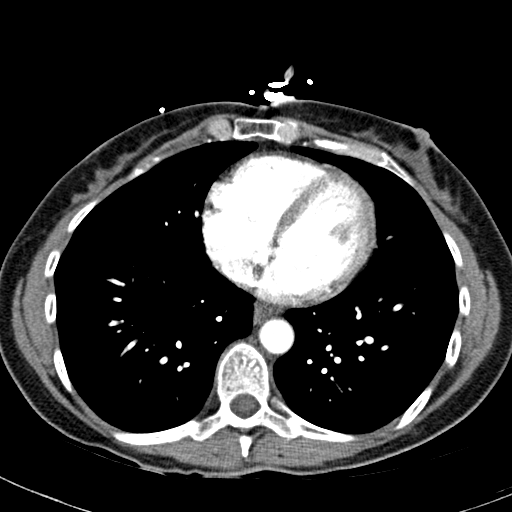
[im 44/87  lung]
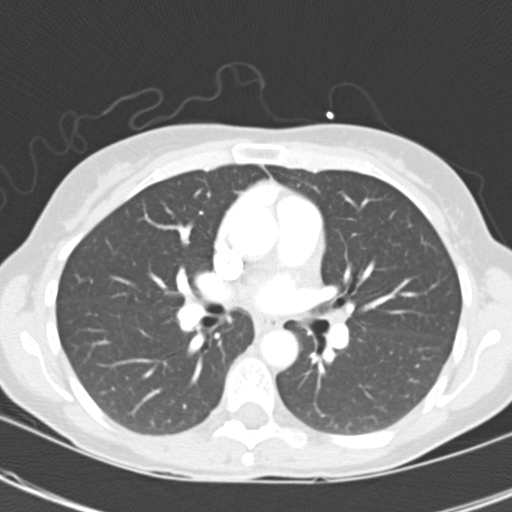
[im 58/87  mediastinal]
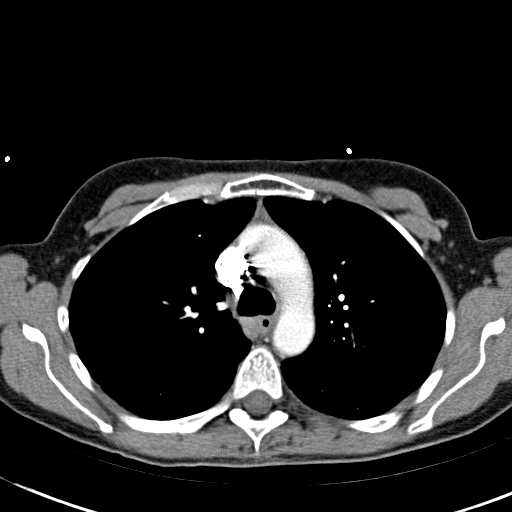
[im 72/87  lung]
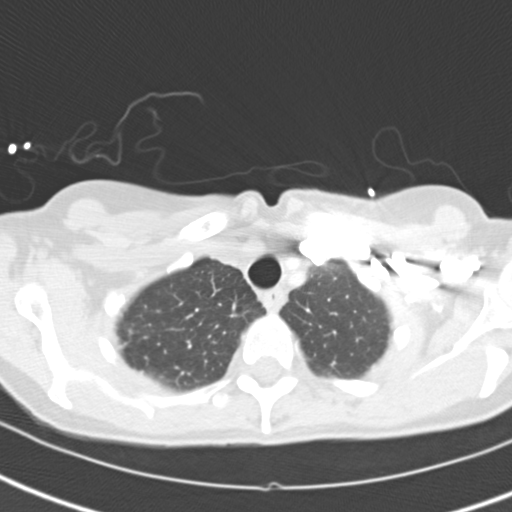

[5 of 36 positions shown; findings below may reference images not displayed]

FINDINGS: The chest wall is unremarkable.  A small scattered
supraclavicular lymph nodes but no adenopathy.  No axillary
adenopathy.  The only the left thyroid lobe is identified.
Probable prior right thyroidectomy.  The bony thorax is intact.  No
destructive bony lesions or spinal canal compromise.

The heart is normal in size.  No pericardial effusion.  No
mediastinal or hilar lymphadenopathy.  The esophagus is grossly
normal.  There is a small to moderate sized hiatal hernia noted.
The aorta is normal in caliber.  No dissection.  The major branch
vessels are patent.

The pulmonary arterial tree is well opacified.  No filling defects
to suggest pulmonary emboli.

Examination of the lung parenchyma demonstrates no acute pulmonary
findings.  No pleural effusion.  No worrisome pulmonary nodules or
masses.  The tracheobronchial tree is unremarkable.

The upper abdomen is unremarkable.

Review of the MIP images confirms the above findings.
IMPRESSION: 1.  No CT findings for pulmonary embolism.
2.  Normal thoracic aorta.
3.  No acute pulmonary findings.

## 2012-12-28 ENCOUNTER — Other Ambulatory Visit (HOSPITAL_COMMUNITY): Payer: Self-pay | Admitting: Obstetrics and Gynecology

## 2012-12-28 DIAGNOSIS — Z139 Encounter for screening, unspecified: Secondary | ICD-10-CM

## 2013-01-23 ENCOUNTER — Ambulatory Visit (HOSPITAL_COMMUNITY)
Admission: RE | Admit: 2013-01-23 | Discharge: 2013-01-23 | Disposition: A | Discharge status: Home / Self Care | Payer: 59 | Source: Ambulatory Visit | Attending: Obstetrics and Gynecology | Admitting: Obstetrics and Gynecology

## 2013-01-23 DIAGNOSIS — Z139 Encounter for screening, unspecified: Secondary | ICD-10-CM

## 2013-01-23 DIAGNOSIS — Z1231 Encounter for screening mammogram for malignant neoplasm of breast: Secondary | ICD-10-CM | POA: Insufficient documentation

## 2013-12-21 ENCOUNTER — Other Ambulatory Visit (HOSPITAL_COMMUNITY): Payer: Self-pay | Admitting: Obstetrics and Gynecology

## 2013-12-21 DIAGNOSIS — Z1231 Encounter for screening mammogram for malignant neoplasm of breast: Secondary | ICD-10-CM

## 2014-01-24 ENCOUNTER — Ambulatory Visit (HOSPITAL_COMMUNITY)
Admission: RE | Admit: 2014-01-24 | Discharge: 2014-01-24 | Disposition: A | Discharge status: Home / Self Care | Payer: 59 | Source: Ambulatory Visit | Attending: Obstetrics and Gynecology | Admitting: Obstetrics and Gynecology

## 2014-01-24 DIAGNOSIS — Z1231 Encounter for screening mammogram for malignant neoplasm of breast: Secondary | ICD-10-CM | POA: Diagnosis present

## 2015-01-14 ENCOUNTER — Other Ambulatory Visit (HOSPITAL_COMMUNITY): Payer: Self-pay | Admitting: Obstetrics and Gynecology

## 2015-01-14 DIAGNOSIS — Z1231 Encounter for screening mammogram for malignant neoplasm of breast: Secondary | ICD-10-CM

## 2015-01-28 ENCOUNTER — Ambulatory Visit (HOSPITAL_COMMUNITY)
Admission: RE | Admit: 2015-01-28 | Discharge: 2015-01-28 | Disposition: A | Discharge status: Home / Self Care | Payer: 59 | Source: Ambulatory Visit | Attending: Obstetrics and Gynecology | Admitting: Obstetrics and Gynecology

## 2015-01-28 DIAGNOSIS — Z1231 Encounter for screening mammogram for malignant neoplasm of breast: Secondary | ICD-10-CM | POA: Insufficient documentation

## 2015-11-13 DIAGNOSIS — C44712 Basal cell carcinoma of skin of right lower limb, including hip: Secondary | ICD-10-CM | POA: Diagnosis not present

## 2015-12-16 DIAGNOSIS — Z85828 Personal history of other malignant neoplasm of skin: Secondary | ICD-10-CM | POA: Diagnosis not present

## 2015-12-16 DIAGNOSIS — Z08 Encounter for follow-up examination after completed treatment for malignant neoplasm: Secondary | ICD-10-CM | POA: Diagnosis not present

## 2016-01-17 ENCOUNTER — Other Ambulatory Visit (HOSPITAL_COMMUNITY): Payer: Self-pay | Admitting: Obstetrics and Gynecology

## 2016-01-17 DIAGNOSIS — Z1231 Encounter for screening mammogram for malignant neoplasm of breast: Secondary | ICD-10-CM

## 2016-01-30 ENCOUNTER — Ambulatory Visit (HOSPITAL_COMMUNITY)
Admission: RE | Admit: 2016-01-30 | Discharge: 2016-01-30 | Disposition: A | Discharge status: Home / Self Care | Payer: 59 | Source: Ambulatory Visit | Attending: Obstetrics and Gynecology | Admitting: Obstetrics and Gynecology

## 2016-01-30 DIAGNOSIS — Z1231 Encounter for screening mammogram for malignant neoplasm of breast: Secondary | ICD-10-CM | POA: Insufficient documentation

## 2016-04-03 DIAGNOSIS — Z6822 Body mass index (BMI) 22.0-22.9, adult: Secondary | ICD-10-CM | POA: Diagnosis not present

## 2016-04-03 DIAGNOSIS — Z01419 Encounter for gynecological examination (general) (routine) without abnormal findings: Secondary | ICD-10-CM | POA: Diagnosis not present

## 2016-06-11 DIAGNOSIS — J329 Chronic sinusitis, unspecified: Secondary | ICD-10-CM | POA: Diagnosis not present

## 2016-06-11 DIAGNOSIS — H6123 Impacted cerumen, bilateral: Secondary | ICD-10-CM | POA: Diagnosis not present

## 2016-12-31 ENCOUNTER — Other Ambulatory Visit (HOSPITAL_COMMUNITY): Payer: Self-pay | Admitting: Obstetrics and Gynecology

## 2016-12-31 DIAGNOSIS — Z1231 Encounter for screening mammogram for malignant neoplasm of breast: Secondary | ICD-10-CM

## 2017-02-01 ENCOUNTER — Ambulatory Visit (HOSPITAL_COMMUNITY)
Admission: RE | Admit: 2017-02-01 | Discharge: 2017-02-01 | Disposition: A | Discharge status: Home / Self Care | Payer: 59 | Source: Ambulatory Visit | Attending: Obstetrics and Gynecology | Admitting: Obstetrics and Gynecology

## 2017-02-01 DIAGNOSIS — Z1231 Encounter for screening mammogram for malignant neoplasm of breast: Secondary | ICD-10-CM | POA: Diagnosis not present

## 2017-04-12 DIAGNOSIS — Z01419 Encounter for gynecological examination (general) (routine) without abnormal findings: Secondary | ICD-10-CM | POA: Diagnosis not present

## 2017-04-12 DIAGNOSIS — Z6822 Body mass index (BMI) 22.0-22.9, adult: Secondary | ICD-10-CM | POA: Diagnosis not present

## 2017-04-16 DIAGNOSIS — Z13820 Encounter for screening for osteoporosis: Secondary | ICD-10-CM | POA: Diagnosis not present

## 2017-08-05 ENCOUNTER — Ambulatory Visit (HOSPITAL_COMMUNITY)
Admission: RE | Admit: 2017-08-05 | Discharge: 2017-08-05 | Disposition: A | Discharge status: Home / Self Care | Payer: 59 | Source: Ambulatory Visit | Attending: Physician Assistant | Admitting: Physician Assistant

## 2017-08-05 ENCOUNTER — Other Ambulatory Visit (HOSPITAL_COMMUNITY): Payer: Self-pay | Admitting: Physician Assistant

## 2017-08-05 DIAGNOSIS — M25552 Pain in left hip: Secondary | ICD-10-CM | POA: Diagnosis not present

## 2017-08-05 DIAGNOSIS — M160 Bilateral primary osteoarthritis of hip: Secondary | ICD-10-CM | POA: Diagnosis not present

## 2017-08-05 DIAGNOSIS — Z6820 Body mass index (BMI) 20.0-20.9, adult: Secondary | ICD-10-CM | POA: Diagnosis not present

## 2017-08-05 DIAGNOSIS — M1612 Unilateral primary osteoarthritis, left hip: Secondary | ICD-10-CM | POA: Diagnosis not present

## 2017-08-27 DIAGNOSIS — M25552 Pain in left hip: Secondary | ICD-10-CM | POA: Diagnosis not present

## 2017-08-27 DIAGNOSIS — M1612 Unilateral primary osteoarthritis, left hip: Secondary | ICD-10-CM | POA: Diagnosis not present

## 2017-09-06 DIAGNOSIS — M25552 Pain in left hip: Secondary | ICD-10-CM | POA: Diagnosis not present

## 2017-09-13 DIAGNOSIS — M1612 Unilateral primary osteoarthritis, left hip: Secondary | ICD-10-CM | POA: Diagnosis not present

## 2017-09-22 DIAGNOSIS — M25552 Pain in left hip: Secondary | ICD-10-CM | POA: Diagnosis not present

## 2017-10-06 DIAGNOSIS — M25552 Pain in left hip: Secondary | ICD-10-CM | POA: Diagnosis not present

## 2018-01-19 ENCOUNTER — Other Ambulatory Visit (HOSPITAL_COMMUNITY): Payer: Self-pay | Admitting: Obstetrics and Gynecology

## 2018-01-19 DIAGNOSIS — Z1231 Encounter for screening mammogram for malignant neoplasm of breast: Secondary | ICD-10-CM

## 2018-02-02 ENCOUNTER — Ambulatory Visit (HOSPITAL_COMMUNITY)
Admission: RE | Admit: 2018-02-02 | Discharge: 2018-02-02 | Disposition: A | Discharge status: Home / Self Care | Payer: 59 | Source: Ambulatory Visit | Attending: Obstetrics and Gynecology | Admitting: Obstetrics and Gynecology

## 2018-02-02 DIAGNOSIS — Z1231 Encounter for screening mammogram for malignant neoplasm of breast: Secondary | ICD-10-CM | POA: Insufficient documentation

## 2018-03-19 ENCOUNTER — Emergency Department (HOSPITAL_COMMUNITY)
Admission: EM | Admit: 2018-03-19 | Discharge: 2018-03-19 | Disposition: A | Discharge status: Home / Self Care | Payer: 59 | Attending: Emergency Medicine | Admitting: Emergency Medicine

## 2018-03-19 ENCOUNTER — Other Ambulatory Visit: Payer: Self-pay

## 2018-03-19 ENCOUNTER — Encounter (HOSPITAL_COMMUNITY): Payer: Self-pay | Admitting: Emergency Medicine

## 2018-03-19 ENCOUNTER — Emergency Department (HOSPITAL_COMMUNITY): Payer: 59

## 2018-03-19 DIAGNOSIS — S9032XA Contusion of left foot, initial encounter: Secondary | ICD-10-CM | POA: Insufficient documentation

## 2018-03-19 DIAGNOSIS — Y929 Unspecified place or not applicable: Secondary | ICD-10-CM | POA: Insufficient documentation

## 2018-03-19 DIAGNOSIS — Y939 Activity, unspecified: Secondary | ICD-10-CM | POA: Insufficient documentation

## 2018-03-19 DIAGNOSIS — W108XXA Fall (on) (from) other stairs and steps, initial encounter: Secondary | ICD-10-CM | POA: Diagnosis not present

## 2018-03-19 DIAGNOSIS — M7989 Other specified soft tissue disorders: Secondary | ICD-10-CM | POA: Diagnosis not present

## 2018-03-19 DIAGNOSIS — Y999 Unspecified external cause status: Secondary | ICD-10-CM | POA: Diagnosis not present

## 2018-03-19 DIAGNOSIS — S99922A Unspecified injury of left foot, initial encounter: Secondary | ICD-10-CM | POA: Diagnosis not present

## 2018-03-19 DIAGNOSIS — Z23 Encounter for immunization: Secondary | ICD-10-CM | POA: Insufficient documentation

## 2018-03-19 MED ORDER — DIPHTH-ACELL PERTUSSIS-TETANUS 25-58-10 LF-MCG/0.5 IM SUSP
0.5000 mL | Freq: Once | INTRAMUSCULAR | Status: AC
  Administered 2018-03-19: 0.5 mL via INTRAMUSCULAR
  Filled 2018-03-19: qty 0.5

## 2018-03-19 NOTE — Discharge Instructions (Signed)
X-ray normal.  Ice, elevate, firm shoe.  You will be sore for several days.

## 2018-03-19 NOTE — ED Provider Notes (Addendum)
[facility] EMERGENCY DEPARTMENT Provider Note   CSN: [id number] Arrival date & time: 03/19/18  1858     History   Chief Complaint Chief Complaint  Patient presents with  . Foot Injury    HPI Heather Arnold is a 57 y.o. female.  Accidentally fell down several stairs and injured the dorsum of her left foot a brief time ago.  No other injuries.  Severity is moderate.  Palpation makes pain worse.     History reviewed. No pertinent past medical history.  Patient Active Problem List   Diagnosis Date Noted  . Hypokalemia 01/16/2011  . Bradycardia 01/16/2011  . Chest pain 01/15/2011  . Vomiting 01/15/2011  . Heartburn 01/15/2011    Past Surgical History:  Procedure Laterality Date  . CHOLECYSTECTOMY    . THYROID SURGERY       OB History   No obstetric history on file.      Home Medications    Prior to Admission medications   Medication Sig Start Date End Date Taking? Authorizing Provider  cetirizine (ZYRTEC) 10 MG tablet Take 10 mg by mouth daily.    [provider]    Family History No family history on file.  Social History Social History   Tobacco Use  . Smoking status: Never Smoker  . Smokeless tobacco: Never Used  Substance Use Topics  . Alcohol use: No  . Drug use: No     Allergies   Patient has no known allergies.   Review of Systems Review of Systems  All other systems reviewed and are negative.    Physical Exam Updated Vital Signs BP (!) 172/74 (BP Location: Right Arm)   Pulse 82   Temp 97.7 F (36.5 C) (Oral)   Resp 18   Ht 5\' 3"  (1.6 m)   Wt 56.7 kg   SpO2 100%   BMI 22.14 kg/m   Physical Exam Vitals signs and nursing note reviewed.  Constitutional:      Appearance: She is well-developed.  HENT:     Head: Normocephalic and atraumatic.  Eyes:     Conjunctiva/sclera: Conjunctivae normal.  Neck:     Musculoskeletal: Neck supple.  Musculoskeletal: Normal range of motion.     Comments: Left foot: Ecchymosis  on dorsum of midfoot approximately 8 cm in diameter.  Tender to palpation  Skin:    General: Skin is warm and dry.  Neurological:     Mental Status: She is alert and oriented to person, place, and time.  Psychiatric:        Behavior: Behavior normal.      ED Treatments / Results  Labs (all labs ordered are listed, but only abnormal results are displayed) Labs Reviewed - No data to display  EKG None  Radiology Dg Foot Complete Left  Result Date: 03/19/2018 CLINICAL DATA:  [address] down stairs tonight injuring LEFT foot EXAM: LEFT FOOT - COMPLETE 3+ VIEW COMPARISON:  None FINDINGS: Mild osseous demineralization. Joint spaces preserved. No acute fracture, dislocation, or bone destruction. Mild soft tissue swelling at the dorsum of the LEFT foot overlying the metatarsals. Small plantar calcaneal spur. IMPRESSION: No acute osseous abnormalities. Electronically Signed   By: Lavonia Dana M.D.   On: 03/19/2018 19:52    Procedures Procedures (including critical care time)  Medications Ordered in ED Medications  diphtheria-acellular pertussis-tetanus (INFANRIX) injection 0.5 mL (0.5 mLs Intramuscular Given 03/19/18 1930)     Initial Impression / Assessment and Plan / ED Course  I have reviewed the  triage vital signs and the nursing notes.  Pertinent labs & imaging results that were available during my care of the patient were reviewed by me and considered in my medical decision making (see chart for details).     X-ray left foot negative.  RICE.  Final Clinical Impressions(s) / ED Diagnoses   Final diagnoses:  Contusion of left foot, initial encounter    ED Discharge Orders    None       Nat Christen, MD 03/19/18 2025    Nat Christen, MD 03/19/18 2026

## 2018-03-19 NOTE — ED Triage Notes (Signed)
Pt states she fell down stairs tonight and injured her left foot.

## 2018-03-19 NOTE — ED Notes (Signed)
Missed last step Landed with body weight on foot  Foot bruised and discolored

## 2018-05-02 DIAGNOSIS — M810 Age-related osteoporosis without current pathological fracture: Secondary | ICD-10-CM | POA: Diagnosis not present

## 2018-05-02 DIAGNOSIS — Z01419 Encounter for gynecological examination (general) (routine) without abnormal findings: Secondary | ICD-10-CM | POA: Diagnosis not present

## 2018-05-02 DIAGNOSIS — Z6822 Body mass index (BMI) 22.0-22.9, adult: Secondary | ICD-10-CM | POA: Diagnosis not present

## 2018-12-19 DIAGNOSIS — H5203 Hypermetropia, bilateral: Secondary | ICD-10-CM | POA: Diagnosis not present

## 2019-01-24 ENCOUNTER — Other Ambulatory Visit (HOSPITAL_COMMUNITY): Payer: Self-pay | Admitting: Obstetrics and Gynecology

## 2019-01-24 DIAGNOSIS — Z1231 Encounter for screening mammogram for malignant neoplasm of breast: Secondary | ICD-10-CM

## 2019-02-10 ENCOUNTER — Other Ambulatory Visit: Payer: Self-pay

## 2019-02-10 ENCOUNTER — Ambulatory Visit (HOSPITAL_COMMUNITY)
Admission: RE | Admit: 2019-02-10 | Discharge: 2019-02-10 | Disposition: A | Discharge status: Home / Self Care | Payer: 59 | Source: Ambulatory Visit | Attending: Obstetrics and Gynecology | Admitting: Obstetrics and Gynecology

## 2019-02-10 DIAGNOSIS — Z1231 Encounter for screening mammogram for malignant neoplasm of breast: Secondary | ICD-10-CM | POA: Diagnosis not present

## 2019-05-08 DIAGNOSIS — Z6822 Body mass index (BMI) 22.0-22.9, adult: Secondary | ICD-10-CM | POA: Diagnosis not present

## 2019-05-08 DIAGNOSIS — M810 Age-related osteoporosis without current pathological fracture: Secondary | ICD-10-CM | POA: Diagnosis not present

## 2019-05-08 DIAGNOSIS — Z13820 Encounter for screening for osteoporosis: Secondary | ICD-10-CM | POA: Diagnosis not present

## 2019-05-08 DIAGNOSIS — Z01419 Encounter for gynecological examination (general) (routine) without abnormal findings: Secondary | ICD-10-CM | POA: Diagnosis not present

## 2019-05-09 DIAGNOSIS — Z01419 Encounter for gynecological examination (general) (routine) without abnormal findings: Secondary | ICD-10-CM | POA: Diagnosis not present

## 2019-07-27 DIAGNOSIS — M1612 Unilateral primary osteoarthritis, left hip: Secondary | ICD-10-CM | POA: Diagnosis not present

## 2019-08-09 DIAGNOSIS — M1612 Unilateral primary osteoarthritis, left hip: Secondary | ICD-10-CM | POA: Diagnosis not present

## 2019-10-06 ENCOUNTER — Encounter (HOSPITAL_COMMUNITY): Payer: Self-pay

## 2019-10-06 ENCOUNTER — Emergency Department (HOSPITAL_COMMUNITY)
Admission: EM | Admit: 2019-10-06 | Discharge: 2019-10-06 | Disposition: A | Discharge status: Home / Self Care | Payer: 59 | Attending: Emergency Medicine | Admitting: Emergency Medicine

## 2019-10-06 ENCOUNTER — Other Ambulatory Visit: Payer: Self-pay

## 2019-10-06 DIAGNOSIS — R109 Unspecified abdominal pain: Secondary | ICD-10-CM | POA: Diagnosis not present

## 2019-10-06 DIAGNOSIS — Z5321 Procedure and treatment not carried out due to patient leaving prior to being seen by health care provider: Secondary | ICD-10-CM | POA: Diagnosis not present

## 2019-10-06 LAB — LIPASE, BLOOD: Lipase: 43 U/L (ref 11–51)

## 2019-10-06 LAB — CBC
HCT: 39.9 % (ref 36.0–46.0)
Hemoglobin: 13.3 g/dL (ref 12.0–15.0)
MCH: 30.9 pg (ref 26.0–34.0)
MCHC: 33.3 g/dL (ref 30.0–36.0)
MCV: 92.8 fL (ref 80.0–100.0)
Platelets: 279 10*3/uL (ref 150–400)
RBC: 4.3 MIL/uL (ref 3.87–5.11)
RDW: 12.4 % (ref 11.5–15.5)
WBC: 12.5 10*3/uL — ABNORMAL HIGH (ref 4.0–10.5)
nRBC: 0 % (ref 0.0–0.2)

## 2019-10-06 LAB — COMPREHENSIVE METABOLIC PANEL
ALT: 13 U/L (ref 0–44)
AST: 19 U/L (ref 15–41)
Albumin: 4.4 g/dL (ref 3.5–5.0)
Alkaline Phosphatase: 77 U/L (ref 38–126)
Anion gap: 10 (ref 5–15)
BUN: 17 mg/dL (ref 6–20)
CO2: 28 mmol/L (ref 22–32)
Calcium: 9.6 mg/dL (ref 8.9–10.3)
Chloride: 103 mmol/L (ref 98–111)
Creatinine, Ser: 0.78 mg/dL (ref 0.44–1.00)
GFR calc Af Amer: >60 mL/min (ref >60)
GFR calc non Af Amer: >60 mL/min (ref >60)
Glucose, Bld: 117 mg/dL — ABNORMAL HIGH (ref 70–99)
Potassium: 3.8 mmol/L (ref 3.5–5.1)
Sodium: 141 mmol/L (ref 135–145)
Total Bilirubin: 0.9 mg/dL (ref 0.3–1.2)
Total Protein: 7.9 g/dL (ref 6.5–8.1)

## 2019-10-06 NOTE — ED Triage Notes (Signed)
Pt to er, pt states that she has a hx of kidney stones, states that she woke up tonight with some L fank pain, states that it feels like a kidney stone.

## 2019-10-07 ENCOUNTER — Emergency Department (HOSPITAL_COMMUNITY): Payer: 59

## 2019-10-07 ENCOUNTER — Emergency Department (HOSPITAL_COMMUNITY)
Admission: EM | Admit: 2019-10-07 | Discharge: 2019-10-07 | Disposition: A | Discharge status: Home / Self Care | Payer: 59 | Attending: Emergency Medicine | Admitting: Emergency Medicine

## 2019-10-07 ENCOUNTER — Encounter (HOSPITAL_COMMUNITY): Payer: Self-pay

## 2019-10-07 ENCOUNTER — Other Ambulatory Visit: Payer: Self-pay

## 2019-10-07 DIAGNOSIS — N201 Calculus of ureter: Secondary | ICD-10-CM | POA: Insufficient documentation

## 2019-10-07 DIAGNOSIS — I700 Atherosclerosis of aorta: Secondary | ICD-10-CM | POA: Diagnosis not present

## 2019-10-07 DIAGNOSIS — N200 Calculus of kidney: Secondary | ICD-10-CM | POA: Diagnosis not present

## 2019-10-07 DIAGNOSIS — N132 Hydronephrosis with renal and ureteral calculous obstruction: Secondary | ICD-10-CM | POA: Diagnosis not present

## 2019-10-07 DIAGNOSIS — R109 Unspecified abdominal pain: Secondary | ICD-10-CM | POA: Diagnosis present

## 2019-10-07 DIAGNOSIS — K449 Diaphragmatic hernia without obstruction or gangrene: Secondary | ICD-10-CM | POA: Diagnosis not present

## 2019-10-07 DIAGNOSIS — N83201 Unspecified ovarian cyst, right side: Secondary | ICD-10-CM | POA: Diagnosis not present

## 2019-10-07 LAB — BASIC METABOLIC PANEL
Anion gap: 12 (ref 5–15)
BUN: 21 mg/dL — ABNORMAL HIGH (ref 6–20)
CO2: 26 mmol/L (ref 22–32)
Calcium: 9.2 mg/dL (ref 8.9–10.3)
Chloride: 102 mmol/L (ref 98–111)
Creatinine, Ser: 1.08 mg/dL — ABNORMAL HIGH (ref 0.44–1.00)
GFR calc Af Amer: >60 mL/min (ref >60)
GFR calc non Af Amer: 56 mL/min — ABNORMAL LOW (ref >60)
Glucose, Bld: 148 mg/dL — ABNORMAL HIGH (ref 70–99)
Potassium: 3.7 mmol/L (ref 3.5–5.1)
Sodium: 140 mmol/L (ref 135–145)

## 2019-10-07 LAB — URINALYSIS, ROUTINE W REFLEX MICROSCOPIC
Bacteria, UA: NONE SEEN
Bilirubin Urine: NEGATIVE
Glucose, UA: 50 mg/dL — AB
Ketones, ur: NEGATIVE mg/dL
Leukocytes,Ua: NEGATIVE
Nitrite: NEGATIVE
Protein, ur: 30 mg/dL — AB
RBC / HPF: >50 RBC/hpf — ABNORMAL HIGH (ref 0–5)
Specific Gravity, Urine: 1.021 (ref 1.005–1.030)
pH: 7 (ref 5.0–8.0)

## 2019-10-07 LAB — CBC WITH DIFFERENTIAL/PLATELET
Abs Immature Granulocytes: 0.06 10*3/uL (ref 0.00–0.07)
Basophils Absolute: 0 10*3/uL (ref 0.0–0.1)
Basophils Relative: 0 %
Eosinophils Absolute: 0 10*3/uL (ref 0.0–0.5)
Eosinophils Relative: 0 %
HCT: 39.8 % (ref 36.0–46.0)
Hemoglobin: 13.4 g/dL (ref 12.0–15.0)
Immature Granulocytes: 1 %
Lymphocytes Relative: 8 %
Lymphs Abs: 0.9 10*3/uL (ref 0.7–4.0)
MCH: 30.7 pg (ref 26.0–34.0)
MCHC: 33.7 g/dL (ref 30.0–36.0)
MCV: 91.1 fL (ref 80.0–100.0)
Monocytes Absolute: 0.4 10*3/uL (ref 0.1–1.0)
Monocytes Relative: 4 %
Neutro Abs: 9.9 10*3/uL — ABNORMAL HIGH (ref 1.7–7.7)
Neutrophils Relative %: 87 %
Platelets: 335 10*3/uL (ref 150–400)
RBC: 4.37 MIL/uL (ref 3.87–5.11)
RDW: 12.5 % (ref 11.5–15.5)
WBC: 11.3 10*3/uL — ABNORMAL HIGH (ref 4.0–10.5)
nRBC: 0 % (ref 0.0–0.2)

## 2019-10-07 MED ORDER — SODIUM CHLORIDE 0.9 % IV BOLUS
1000.0000 mL | Freq: Once | INTRAVENOUS | Status: AC
  Administered 2019-10-07: 1000 mL via INTRAVENOUS

## 2019-10-07 MED ORDER — CEPHALEXIN 500 MG PO CAPS
500.0000 mg | ORAL_CAPSULE | Freq: Four times a day (QID) | ORAL | 0 refills | Status: DC
Start: 2019-10-07 — End: 2019-10-16

## 2019-10-07 MED ORDER — MORPHINE SULFATE (PF) 4 MG/ML IV SOLN
4.0000 mg | Freq: Once | INTRAVENOUS | Status: AC
  Administered 2019-10-07: 4 mg via INTRAVENOUS
  Filled 2019-10-07: qty 1

## 2019-10-07 MED ORDER — HYDROCODONE-ACETAMINOPHEN 5-325 MG PO TABS: 1.0000 | ORAL_TABLET | Freq: Four times a day (QID) | ORAL | 0 refills | Status: DC | PRN

## 2019-10-07 MED ORDER — ONDANSETRON HCL 4 MG PO TABS
4.0000 mg | ORAL_TABLET | Freq: Three times a day (TID) | ORAL | 0 refills | Status: DC | PRN
Start: 2019-10-07 — End: 2019-10-25

## 2019-10-07 MED ORDER — SODIUM CHLORIDE 0.9 % IV SOLN
1.0000 g | Freq: Once | INTRAVENOUS | Status: AC
  Administered 2019-10-07: 1 g via INTRAVENOUS
  Filled 2019-10-07: qty 10

## 2019-10-07 MED ORDER — ONDANSETRON HCL 4 MG/2ML IJ SOLN
4.0000 mg | Freq: Once | INTRAMUSCULAR | Status: AC
  Administered 2019-10-07: 4 mg via INTRAVENOUS
  Filled 2019-10-07: qty 2

## 2019-10-07 MED ORDER — TAMSULOSIN HCL 0.4 MG PO CAPS: 0.4000 mg | ORAL_CAPSULE | Freq: Every day | ORAL | 0 refills | Status: DC

## 2019-10-07 MED ORDER — KETOROLAC TROMETHAMINE 30 MG/ML IJ SOLN
30.0000 mg | Freq: Once | INTRAMUSCULAR | Status: AC
  Administered 2019-10-07: 30 mg via INTRAVENOUS
  Filled 2019-10-07: qty 1

## 2019-10-07 NOTE — ED Triage Notes (Signed)
Pt reports left flank since yesterday. Reports vomiting during the night

## 2019-10-07 NOTE — ED Provider Notes (Signed)
[facility] EMERGENCY DEPARTMENT Provider Note   CSN: [id number] Arrival date & time: 10/07/19  0840     History Chief Complaint  Patient presents with  . Flank Pain    Heather Arnold is a 59 y.o. female.  She has remote history of kidney stones.  Complaining of left flank pain that started last evening.  Severe in nature.  Some suprapubic pain.  Associated with nausea and vomiting.  Came here last evening but the wait was too long so went home.  No fevers or chills.  The history is provided by the patient.  Flank Pain This is a new problem. The current episode started yesterday. The problem occurs constantly. The problem has not changed since onset.Associated symptoms include abdominal pain. Pertinent negatives include no chest pain, no headaches and no shortness of breath. Nothing aggravates the symptoms. Nothing relieves the symptoms. She has tried nothing for the symptoms. The treatment provided no relief.       History reviewed. No pertinent past medical history.  Patient Active Problem List   Diagnosis Date Noted  . Hypokalemia 01/16/2011  . Bradycardia 01/16/2011  . Chest pain 01/15/2011  . Vomiting 01/15/2011  . Heartburn 01/15/2011    Past Surgical History:  Procedure Laterality Date  . CHOLECYSTECTOMY    . THYROID SURGERY       OB History   No obstetric history on file.     No family history on file.  Social History   Tobacco Use  . Smoking status: Never Smoker  . Smokeless tobacco: Never Used  Substance Use Topics  . Alcohol use: No  . Drug use: No    Home Medications Prior to Admission medications   Medication Sig Start Date End Date Taking? Authorizing Provider  cetirizine (ZYRTEC) 10 MG tablet Take 10 mg by mouth daily.    [provider]    Allergies    Patient has no known allergies.  Review of Systems   Review of Systems  Constitutional: Negative for fever.  HENT: Negative for sore throat.   Eyes: Negative for visual  disturbance.  Respiratory: Negative for shortness of breath.   Cardiovascular: Negative for chest pain.  Gastrointestinal: Positive for abdominal pain, nausea and vomiting.  Genitourinary: Positive for flank pain. Negative for dysuria.  Musculoskeletal: Negative for neck pain.  Skin: Negative for rash.  Neurological: Negative for headaches.    Physical Exam Updated Vital Signs BP (!) 175/70 (BP Location: Left Arm)   Pulse 82   Temp 98.2 F (36.8 C) (Oral)   Ht 5\' 3"  (1.6 m)   Wt 58 kg   SpO2 98%   BMI 22.65 kg/m   Physical Exam Vitals and nursing note reviewed.  Constitutional:      General: She is not in acute distress.    Appearance: Normal appearance. She is well-developed.  HENT:     Head: Normocephalic and atraumatic.  Eyes:     Conjunctiva/sclera: Conjunctivae normal.  Cardiovascular:     Rate and Rhythm: Normal rate and regular rhythm.     Heart sounds: No murmur heard.   Pulmonary:     Effort: Pulmonary effort is normal. No respiratory distress.     Breath sounds: Normal breath sounds.  Abdominal:     Palpations: Abdomen is soft.     Tenderness: There is no abdominal tenderness. There is no right CVA tenderness or left CVA tenderness.  Musculoskeletal:        General: No deformity or signs of  injury. Normal range of motion.     Cervical back: Neck supple.  Skin:    General: Skin is warm and dry.     Capillary Refill: Capillary refill takes less than 2 seconds.  Neurological:     General: No focal deficit present.     Mental Status: She is alert.     Gait: Gait normal.     ED Results / Procedures / Treatments   Labs (all labs ordered are listed, but only abnormal results are displayed) Labs Reviewed  URINALYSIS, ROUTINE W REFLEX MICROSCOPIC - Abnormal; Notable for the following components:      Result Value   APPearance CLOUDY (*)    Glucose, UA 50 (*)    Hgb urine dipstick LARGE (*)    Protein, ur 30 (*)    RBC / HPF >50 (*)    All other  components within normal limits  BASIC METABOLIC PANEL - Abnormal; Notable for the following components:   Glucose, Bld 148 (*)    BUN 21 (*)    Creatinine, Ser 1.08 (*)    GFR calc non Af Amer 56 (*)    All other components within normal limits  CBC WITH DIFFERENTIAL/PLATELET - Abnormal; Notable for the following components:   WBC 11.3 (*)    Neutro Abs 9.9 (*)    All other components within normal limits  URINE CULTURE    EKG None  Radiology CT Renal Stone Study  Result Date: 10/07/2019 CLINICAL DATA:  Left-sided flank pain with nausea and vomiting EXAM: CT ABDOMEN AND PELVIS WITHOUT CONTRAST TECHNIQUE: Multidetector CT imaging of the abdomen and pelvis was performed following the standard protocol without oral or IV contrast. COMPARISON:  None. FINDINGS: Lower chest: Lung bases are clear.  There is a focal hiatal hernia. Hepatobiliary: No focal liver lesions are appreciable on this noncontrast enhanced study. The gallbladder is absent. There is no appreciable biliary duct dilatation. Pancreas: There is no pancreatic mass or inflammatory focus. Spleen: No splenic lesions are evident. Adrenals/Urinary Tract: Adrenals bilaterally appear normal. There is a cyst arising from the lateral upper right kidney measuring 1.8 x 1.8 cm. There is a cyst in the anterior upper left kidney measuring 1.7 x 1.7 cm. There is mild hydronephrosis on the left. There is no hydronephrosis on the right. There is no intrarenal calculus on either side. There is a 4 x 3 mm calculus in the proximal left ureter at the L3-4 level. No other ureteral calculi are appreciable. The urinary bladder is midline with wall thickness within normal limits. Stomach/Bowel: There is no appreciable bowel wall or mesenteric thickening. The terminal ileum appears normal. There is no evident bowel obstruction. There is no free air or portal venous air. Vascular/Lymphatic: There is no abdominal aortic aneurysm. There are scattered foci of  aortic atherosclerosis. There is no evident adenopathy in the abdomen or pelvis. Reproductive: The uterus is anteverted. There is a cyst arising in the right adnexa measuring 3.8 x 3.4 cm. No other pelvic mass evident. Other: Appendix appears normal. No abscess or ascites is evident in the abdomen or pelvis. Musculoskeletal: No blastic or lytic bone lesions. No intramuscular or abdominal wall lesions are evident. IMPRESSION: 1. 4 x 3 mm calculus in the proximal left ureter at the L3-4 level with mild hydronephrosis on the left. 2. Right ovarian/adnexal cyst measuring 3.8 x 3.4 cm. Simple cyst most likely etiology. No other pelvic mass. 3. No bowel obstruction. No abscess in the abdomen or pelvis. Appendix  appears normal. 4.  Gallbladder absent. 5.  Focal hiatal hernia present. 6.  Aortic Atherosclerosis (ICD10-I70.0). Electronically Signed   By: Lowella Grip III M.D.   On: 10/07/2019 09:28    Procedures Procedures (including critical care time)  Medications Ordered in ED Medications  ketorolac (TORADOL) 30 MG/ML injection 30 mg (30 mg Intravenous Given 10/07/19 0927)  sodium chloride 0.9 % bolus 1,000 mL (0 mLs Intravenous Stopped 10/07/19 1043)  ondansetron (ZOFRAN) injection 4 mg (4 mg Intravenous Given 10/07/19 0926)  morphine 4 MG/ML injection 4 mg (4 mg Intravenous Given 10/07/19 0929)  cefTRIAXone (ROCEPHIN) 1 g in sodium chloride 0.9 % 100 mL IVPB (0 g Intravenous Stopped 10/07/19 1137)    ED Course  I have reviewed the triage vital signs and the nursing notes.  Pertinent labs & imaging results that were available during my care of the patient were reviewed by me and considered in my medical decision making (see chart for details).    MDM Rules/Calculators/A&P                         This patient complains of left flank pain; this involves an extensive number of treatment Options and is a complaint that carries with it a high risk of complications and Morbidity. The differential  includes pyelonephritis, renal colic, retroperitoneal bleed colitis, diverticulitis  I ordered, reviewed and interpreted labs, which included CBC with elevated white count, stable hemoglobin, chemistries with elevated glucose BUN and creatinine reflecting some dehydration, urinalysis with large hemoglobin, greater than 50 reds, 21-50 whites no bacteria seen nitrite negative I ordered medication morphine Toradol fluids and IV antibiotics I ordered imaging studies which included CT renal study and I independently    visualized and interpreted imaging which showed proximal ureteral stone on the left with hydronephrosis Previous records obtained and reviewed in epic, recent admissions  After the interventions stated above, I reevaluated the patient and found patient's pain to be improved.  Have given her an IV dose of antibiotics for the equivocal urine possible infection.  She otherwise feels well and I think this can be managed as an outpatient.  I provided her prescriptions for pain medication antibiotics nausea medication and Flomax.  Return instructions discussed.  Final Clinical Impression(s) / ED Diagnoses Final diagnoses:  Ureterolithiasis    Rx / DC Orders ED Discharge Orders         Ordered    cephALEXin (KEFLEX) 500 MG capsule  4 times daily     Discontinue  Reprint     10/07/19 1143    ondansetron (ZOFRAN) 4 MG tablet  Every 8 hours PRN     Discontinue  Reprint     10/07/19 1143    HYDROcodone-acetaminophen (NORCO/VICODIN) 5-325 MG tablet  Every 6 hours PRN     Discontinue  Reprint     10/07/19 1143    tamsulosin (FLOMAX) 0.4 MG CAPS capsule  Daily     Discontinue  Reprint     10/07/19 1143           Hayden Rasmussen, MD 10/07/19 1728

## 2019-10-07 NOTE — Discharge Instructions (Signed)
You were seen in the emergency department for left flank pain.  You had a 4 mm kidney stone.  This may pass on its own.  We are prescribing you pain medicine nausea medicine and medication for antibiotics and to help dilate the tube.  Please return to the emergency department if any fever or uncontrolled pain.  Schedule a follow-up with urology.

## 2019-10-10 LAB — URINE CULTURE: Culture: 30000 — AB

## 2019-10-11 ENCOUNTER — Ambulatory Visit: Payer: 59 | Admitting: Urology

## 2019-10-11 ENCOUNTER — Encounter: Payer: Self-pay | Admitting: Urology

## 2019-10-11 ENCOUNTER — Other Ambulatory Visit: Payer: Self-pay

## 2019-10-11 ENCOUNTER — Ambulatory Visit (HOSPITAL_COMMUNITY)
Admission: RE | Admit: 2019-10-11 | Discharge: 2019-10-11 | Disposition: A | Discharge status: Home / Self Care | Payer: 59 | Source: Ambulatory Visit | Attending: Urology | Admitting: Urology

## 2019-10-11 VITALS — BP 128/55 | HR 71 | Temp 97.2°F | Ht 63.0 in | Wt 130.0 lb

## 2019-10-11 DIAGNOSIS — N200 Calculus of kidney: Secondary | ICD-10-CM

## 2019-10-11 DIAGNOSIS — Z9851 Tubal ligation status: Secondary | ICD-10-CM | POA: Diagnosis not present

## 2019-10-11 DIAGNOSIS — I878 Other specified disorders of veins: Secondary | ICD-10-CM | POA: Diagnosis not present

## 2019-10-11 LAB — POCT URINALYSIS DIPSTICK
Bilirubin, UA: NEGATIVE
Glucose, UA: NEGATIVE
Leukocytes, UA: NEGATIVE
Nitrite, UA: NEGATIVE
Protein, UA: NEGATIVE
Spec Grav, UA: <=1.005 — AB (ref 1.010–1.025)
Urobilinogen, UA: 0.2 E.U./dL
pH, UA: 8 (ref 5.0–8.0)

## 2019-10-11 NOTE — Progress Notes (Signed)
Urological Symptom Review  Patient is experiencing the following symptoms: Kidney stones   Review of Systems  Gastrointestinal (upper)  : Negative for upper GI symptoms  Gastrointestinal (lower) : Negative for lower GI symptoms  Constitutional : Negative for symptoms  Skin: Negative for skin symptoms  Eyes: Negative for eye symptoms  Ear/Nose/Throat : Negative for Ear/Nose/Throat symptoms  Hematologic/Lymphatic: Negative for Hematologic/Lymphatic symptoms  Cardiovascular : Negative for cardiovascular symptoms  Respiratory : Negative for respiratory symptoms  Endocrine: Negative for endocrine symptoms  Musculoskeletal: Negative for musculoskeletal symptoms  Neurological: Negative for neurological symptoms  Psychologic: Negative for psychiatric symptoms  

## 2019-10-13 ENCOUNTER — Encounter (HOSPITAL_COMMUNITY)
Admission: RE | Admit: 2019-10-13 | Discharge: 2019-10-13 | Disposition: A | Discharge status: Home / Self Care | Payer: 59 | Source: Ambulatory Visit | Attending: Urology | Admitting: Urology

## 2019-10-13 ENCOUNTER — Other Ambulatory Visit (HOSPITAL_COMMUNITY)
Admission: RE | Admit: 2019-10-13 | Discharge: 2019-10-13 | Disposition: A | Discharge status: Home / Self Care | Payer: 59 | Source: Ambulatory Visit | Attending: Urology | Admitting: Urology

## 2019-10-13 ENCOUNTER — Other Ambulatory Visit: Payer: Self-pay

## 2019-10-13 ENCOUNTER — Encounter (HOSPITAL_COMMUNITY): Payer: Self-pay

## 2019-10-13 DIAGNOSIS — Z01812 Encounter for preprocedural laboratory examination: Secondary | ICD-10-CM | POA: Insufficient documentation

## 2019-10-13 DIAGNOSIS — Z20822 Contact with and (suspected) exposure to covid-19: Secondary | ICD-10-CM | POA: Insufficient documentation

## 2019-10-13 HISTORY — DX: Unspecified ovarian cyst, unspecified side: N83.209

## 2019-10-13 HISTORY — DX: Diaphragmatic hernia without obstruction or gangrene: K44.9

## 2019-10-13 HISTORY — DX: Atherosclerotic heart disease of native coronary artery without angina pectoris: I25.10

## 2019-10-13 NOTE — Patient Instructions (Signed)
NOVALIE LEAMY  10/13/2019     @PREFPERIOPPHARMACY @   Your procedure is scheduled on 10/16/2019.  Report to [facility] at 8:00 A.M.  Call this number if you have problems the morning of surgery:  [phone]   Remember:  Do not eat or drink after midnight.  Take these medicines the morning of surgery with A SIP OF WATER ; Flomax, Zofran and Hydrocodone(if needed)    Do not wear jewelry, make-up or nail polish.  Do not wear lotions, powders, or perfumes, or deodorant.  Do not shave 48 hours prior to surgery.  Men may shave face and neck.  Do not bring valuables to the hospital.  [facility] is not responsible for any belongings or valuables.  Contacts, dentures or bridgework may not be worn into surgery.  Leave your suitcase in the car.  After surgery it may be brought to your room.  For patients admitted to the hospital, discharge time will be determined by your treatment team.  Patients discharged the day of surgery will not be allowed to drive home.   Name and phone number of your driver:   family Special instructions:  n/a  Please read over the following fact sheets that you were given. Care and Recovery After Surgery   Cystoscopy Cystoscopy is a procedure that is used to help diagnose and sometimes treat conditions that affect the lower urinary tract. The lower urinary tract includes the bladder and the urethra. The urethra is the tube that drains urine from the bladder. Cystoscopy is done using a thin, tube-shaped instrument with a light and camera at the end (cystoscope). The cystoscope may be hard or flexible, depending on the goal of the procedure. The cystoscope is inserted through the urethra, into the bladder. Cystoscopy may be recommended if you have:  Urinary tract infections that keep coming back.  Blood in the urine (hematuria).  An inability to control when you urinate (urinary incontinence) or an overactive bladder.  Unusual cells found in a urine  sample.  A blockage in the urethra, such as a urinary stone.  Painful urination.  An abnormality in the bladder found during an intravenous pyelogram (IVP) or CT scan. Cystoscopy may also be done to remove a sample of tissue to be examined under a microscope (biopsy). Tell a health care provider about:  Any allergies you have.  All medicines you are taking, including vitamins, herbs, eye drops, creams, and over-the-counter medicines.  Any problems you or family members have had with anesthetic medicines.  Any blood disorders you have.  Any surgeries you have had.  Any medical conditions you have.  Whether you are pregnant or may be pregnant. What are the risks? Generally, this is a safe procedure. However, problems may occur, including:  Infection.  Bleeding.  Allergic reactions to medicines.  Damage to other structures or organs. What happens before the procedure?  Ask your health care provider about: ? Changing or stopping your regular medicines. This is especially important if you are taking diabetes medicines or blood thinners. ? Taking medicines such as aspirin and ibuprofen. These medicines can thin your blood. Do not take these medicines unless your health care provider tells you to take them. ? Taking over-the-counter medicines, vitamins, herbs, and supplements.  Follow instructions from your health care provider about eating or drinking restrictions.  Ask your health care provider what steps will be taken to help prevent infection. These may include: ? Washing skin with a germ-killing soap. ? Taking  antibiotic medicine.  You may have an exam or testing, such as: ? X-rays of the bladder, urethra, or kidneys. ? Urine tests to check for signs of infection.  Plan to have someone take you home from the hospital or clinic. What happens during the procedure?   You will be given one or more of the following: ? A medicine to help you relax (sedative). ? A  medicine to numb the area (local anesthetic).  The area around the opening of your urethra will be cleaned.  The cystoscope will be passed through your urethra into your bladder.  Germ-free (sterile) fluid will flow through the cystoscope to fill your bladder. The fluid will stretch your bladder so that your health care provider can clearly examine your bladder walls.  Your doctor will look at the urethra and bladder. Your doctor may take a biopsy or remove stones.  The cystoscope will be removed, and your bladder will be emptied. The procedure may vary among health care providers and hospitals. What can I expect after the procedure? After the procedure, it is common to have:  Some soreness or pain in your abdomen and urethra.  Urinary symptoms. These include: ? Mild pain or burning when you urinate. Pain should stop within a few minutes after you urinate. This may last for up to 1 week. ? A small amount of blood in your urine for several days. ? Feeling like you need to urinate but producing only a small amount of urine. Follow these instructions at home: Medicines  Take over-the-counter and prescription medicines only as told by your health care provider.  If you were prescribed an antibiotic medicine, take it as told by your health care provider. Do not stop taking the antibiotic even if you start to feel better. General instructions  Return to your normal activities as told by your health care provider. Ask your health care provider what activities are safe for you.  Do not drive for 24 hours if you were given a sedative during your procedure.  Watch for any blood in your urine. If the amount of blood in your urine increases, call your health care provider.  Follow instructions from your health care provider about eating or drinking restrictions.  If a tissue sample was removed for testing (biopsy) during your procedure, it is up to you to get your test results. Ask your  health care provider, or the department that is doing the test, when your results will be ready.  Drink enough fluid to keep your urine pale yellow.  Keep all follow-up visits as told by your health care provider. This is important. Contact a health care provider if you:  Have pain that gets worse or does not get better with medicine, especially pain when you urinate.  Have trouble urinating.  Have more blood in your urine. Get help right away if you:  Have blood clots in your urine.  Have abdominal pain.  Have a fever or chills.  Are unable to urinate. Summary  Cystoscopy is a procedure that is used to help diagnose and sometimes treat conditions that affect the lower urinary tract.  Cystoscopy is done using a thin, tube-shaped instrument with a light and camera at the end.  After the procedure, it is common to have some soreness or pain in your abdomen and urethra.  Watch for any blood in your urine. If the amount of blood in your urine increases, call your health care provider.  If you were prescribed an  antibiotic medicine, take it as told by your health care provider. Do not stop taking the antibiotic even if you start to feel better. This information is not intended to replace advice given to you by your health care provider. Make sure you discuss any questions you have with your health care provider. Document Revised: 03/08/2018 Document Reviewed: 03/08/2018 Elsevier Patient Education  [employer].   Ureteral Stent Implantation  Ureteral stent implantation is a procedure to insert (implant) a flexible, soft, plastic tube (stent) into a ureter. Ureters are the tube-like parts of the body that drain urine from the kidneys. The stent supports the ureter while it heals and helps to drain urine. You may have a ureteral stent implanted after having a procedure to remove a blockage from the ureter (ureterolysis or pyeloplasty). You may also have a stent implanted to open  the flow of urine when you have a blockage caused by a kidney stone, tumor, blood clot, or infection. You have two ureters, one on each side of the body. The ureters connect the kidneys to the organ that holds urine until it passes out of the body (bladder). The stent is placed so that one end is in the kidney, and one end is in the bladder. The stent is usually taken out after your ureter has healed. Depending on your condition, you may have a stent for just a few weeks, or you may have a long-term stent that will need to be replaced every few months. Tell a health care provider about:  Any allergies you have.  All medicines you are taking, including vitamins, herbs, eye drops, creams, and over-the-counter medicines.  Any problems you or family members have had with anesthetic medicines.  Any blood disorders you have.  Any surgeries you have had.  Any medical conditions you have.  Whether you are pregnant or may be pregnant. What are the risks? Generally, this is a safe procedure. However, problems may occur, including:  Infection.  Bleeding.  Allergic reactions to medicines.  Damage to other structures or organs. Tearing (perforation) of the ureter is possible.  Movement of the stent away from where it is placed during surgery (migration). What happens before the procedure? Medicines Ask your health care provider about:  Changing or stopping your regular medicines. This is especially important if you are taking diabetes medicines or blood thinners.  Taking medicines such as aspirin and ibuprofen. These medicines can thin your blood. Do not take these medicines unless your health care provider tells you to take them.  Taking over-the-counter medicines, vitamins, herbs, and supplements. Eating and drinking Follow instructions from your health care provider about eating and drinking, which may include:  8 hours before the procedure - stop eating heavy meals or foods, such as  meat, fried foods, or fatty foods.  6 hours before the procedure - stop eating light meals or foods, such as toast or cereal.  6 hours before the procedure - stop drinking milk or drinks that contain milk.  2 hours before the procedure - stop drinking clear liquids. Staying hydrated Follow instructions from your health care provider about hydration, which may include:  Up to 2 hours before the procedure - you may continue to drink clear liquids, such as water, clear fruit juice, black coffee, and plain tea. General instructions  Do not drink alcohol.  Do not use any products that contain nicotine or tobacco for at least 4 weeks before the procedure. These products include cigarettes, e-cigarettes, and chewing  tobacco. If you need help quitting, ask your health care provider.  You may have an exam or testing, such as imaging or blood tests.  Ask your health care provider what steps will be taken to help prevent infection. These may include: ? Removing hair at the surgery site. ? Washing skin with a germ-killing soap. ? Taking antibiotic medicine.  Plan to have someone take you home from the hospital or clinic.  If you will be going home right after the procedure, plan to have someone with you for 24 hours. What happens during the procedure?  An IV will be inserted into one of your veins.  You may be given a medicine to help you relax (sedative).  You may be given a medicine to make you fall asleep (general anesthetic).  A thin, tube-shaped instrument with a light and tiny camera at the end (cystoscope) will be inserted into your urethra. The urethra is the tube that drains urine from the bladder out of the body. In men, the urethra opens at the end of the penis. In women, the urethra opens in front of the vaginal opening.  The cystoscope will be passed into your bladder.  A thin wire (guide wire) will be passed through your bladder and into your ureter. This is used to guide the  stent into your ureter.  The stent will be inserted into your ureter.  The guide wire and the cystoscope will be removed.  A flexible tube (catheter) may be inserted through your urethra so that one end is in your bladder. This helps to drain urine from your bladder. The procedure may vary among hospitals and health care providers. What happens after the procedure?  Your blood pressure, heart rate, breathing rate, and blood oxygen level will be monitored until you leave the hospital or clinic.  You may continue to receive medicine and fluids through an IV.  You may have some soreness or pain in your abdomen and urethra. Medicines will be available to help you.  You will be encouraged to get up and walk around as soon as you can.  You may have a catheter draining your urine.  You will have some blood in your urine.  Do not drive for 24 hours if you were given a sedative during your procedure. Summary  Ureteral stent implantation is a procedure to insert a flexible, soft, plastic tube (stent) into a ureter.  You may have a stent implanted to support the ureter while it heals after a procedure or to open the flow of urine if there is a blockage.  Follow instructions from your health care provider about taking medicines and about eating and drinking before the procedure.  Depending on your condition, you may have a stent for just a few weeks, or you may have a long-term stent that will need to be replaced every few months. This information is not intended to replace advice given to you by your health care provider. Make sure you discuss any questions you have with your health care provider. Document Revised: 12/21/2017 Document Reviewed: 12/22/2017 Elsevier Patient Education  2020 [employer].

## 2019-10-14 LAB — SARS CORONAVIRUS 2 (TAT 6-24 HRS): SARS Coronavirus 2: NEGATIVE

## 2019-10-16 ENCOUNTER — Encounter: Payer: Self-pay | Admitting: Urology

## 2019-10-16 ENCOUNTER — Encounter (HOSPITAL_COMMUNITY): Payer: Self-pay | Admitting: Urology

## 2019-10-16 ENCOUNTER — Ambulatory Visit (HOSPITAL_COMMUNITY): Payer: 59

## 2019-10-16 ENCOUNTER — Ambulatory Visit (HOSPITAL_COMMUNITY)
Admission: RE | Admit: 2019-10-16 | Discharge: 2019-10-16 | Disposition: A | Discharge status: Home / Self Care | Payer: 59 | Source: Ambulatory Visit | Attending: Urology | Admitting: Urology

## 2019-10-16 ENCOUNTER — Ambulatory Visit (HOSPITAL_COMMUNITY): Payer: 59 | Admitting: Anesthesiology

## 2019-10-16 ENCOUNTER — Encounter (HOSPITAL_COMMUNITY)
Admission: RE | Disposition: A | Discharge status: Home / Self Care | Payer: Self-pay | Source: Ambulatory Visit | Attending: Urology

## 2019-10-16 DIAGNOSIS — K449 Diaphragmatic hernia without obstruction or gangrene: Secondary | ICD-10-CM | POA: Diagnosis not present

## 2019-10-16 DIAGNOSIS — N201 Calculus of ureter: Secondary | ICD-10-CM | POA: Diagnosis not present

## 2019-10-16 DIAGNOSIS — N200 Calculus of kidney: Secondary | ICD-10-CM

## 2019-10-16 DIAGNOSIS — Z79899 Other long term (current) drug therapy: Secondary | ICD-10-CM | POA: Insufficient documentation

## 2019-10-16 DIAGNOSIS — Z7983 Long term (current) use of bisphosphonates: Secondary | ICD-10-CM | POA: Insufficient documentation

## 2019-10-16 DIAGNOSIS — I2510 Atherosclerotic heart disease of native coronary artery without angina pectoris: Secondary | ICD-10-CM | POA: Diagnosis not present

## 2019-10-16 HISTORY — PX: CYSTOSCOPY WITH RETROGRADE PYELOGRAM, URETEROSCOPY AND STENT PLACEMENT: SHX5789

## 2019-10-16 HISTORY — DX: Calculus of kidney: N20.0

## 2019-10-16 HISTORY — PX: CYSTOSCOPY WITH HOLMIUM LASER LITHOTRIPSY: SHX6639

## 2019-10-16 SURGERY — CYSTOURETEROSCOPY, WITH RETROGRADE PYELOGRAM AND STENT INSERTION
Anesthesia: General | Site: Ureter | Laterality: Left

## 2019-10-16 MED ORDER — PROPOFOL 10 MG/ML IV BOLUS
INTRAVENOUS | Status: AC
  Filled 2019-10-16: qty 20

## 2019-10-16 MED ORDER — PROMETHAZINE HCL 25 MG/ML IJ SOLN: 6.2500 mg | INTRAMUSCULAR | Status: DC | PRN

## 2019-10-16 MED ORDER — HYDROCODONE-ACETAMINOPHEN 5-325 MG PO TABS: 1.0000 | ORAL_TABLET | Freq: Four times a day (QID) | ORAL | 0 refills | Status: DC | PRN

## 2019-10-16 MED ORDER — ONDANSETRON HCL 4 MG/2ML IJ SOLN
INTRAMUSCULAR | Status: DC | PRN
  Administered 2019-10-16: 4 mg via INTRAVENOUS

## 2019-10-16 MED ORDER — WATER FOR IRRIGATION, STERILE IR SOLN
Status: DC | PRN
  Administered 2019-10-16: 500 mL

## 2019-10-16 MED ORDER — MEPERIDINE HCL 50 MG/ML IJ SOLN: 6.2500 mg | INTRAMUSCULAR | Status: DC | PRN

## 2019-10-16 MED ORDER — SCOPOLAMINE 1 MG/3DAYS TD PT72
1.0000 | MEDICATED_PATCH | Freq: Once | TRANSDERMAL | Status: DC
  Administered 2019-10-16: 1.5 mg via TRANSDERMAL

## 2019-10-16 MED ORDER — FENTANYL CITRATE (PF) 100 MCG/2ML IJ SOLN
INTRAMUSCULAR | Status: AC
  Filled 2019-10-16: qty 2

## 2019-10-16 MED ORDER — EPHEDRINE 5 MG/ML INJ
INTRAVENOUS | Status: AC
  Filled 2019-10-16: qty 10

## 2019-10-16 MED ORDER — DIATRIZOATE MEGLUMINE 30 % UR SOLN
URETHRAL | Status: DC | PRN
  Administered 2019-10-16: 4 mL via URETHRAL

## 2019-10-16 MED ORDER — SODIUM CHLORIDE 0.9 % IR SOLN
Status: DC | PRN
  Administered 2019-10-16: 3000 mL via INTRAVESICAL

## 2019-10-16 MED ORDER — SCOPOLAMINE 1 MG/3DAYS TD PT72
MEDICATED_PATCH | TRANSDERMAL | Status: AC
  Filled 2019-10-16: qty 1

## 2019-10-16 MED ORDER — FENTANYL CITRATE (PF) 100 MCG/2ML IJ SOLN
INTRAMUSCULAR | Status: DC | PRN
  Administered 2019-10-16 (×2): 50 ug via INTRAVENOUS

## 2019-10-16 MED ORDER — DIATRIZOATE MEGLUMINE 30 % UR SOLN
URETHRAL | Status: AC
  Filled 2019-10-16: qty 100

## 2019-10-16 MED ORDER — LIDOCAINE HCL (CARDIAC) PF 50 MG/5ML IV SOSY
PREFILLED_SYRINGE | INTRAVENOUS | Status: DC | PRN
  Administered 2019-10-16: 100 mg via INTRAVENOUS

## 2019-10-16 MED ORDER — ORAL CARE MOUTH RINSE: 15.0000 mL | Freq: Once | OROMUCOSAL | Status: AC

## 2019-10-16 MED ORDER — PROPOFOL 10 MG/ML IV BOLUS
INTRAVENOUS | Status: DC | PRN
  Administered 2019-10-16: 150 mg via INTRAVENOUS

## 2019-10-16 MED ORDER — DEXAMETHASONE SODIUM PHOSPHATE 10 MG/ML IJ SOLN
INTRAMUSCULAR | Status: DC | PRN
  Administered 2019-10-16: 10 mg via INTRAVENOUS

## 2019-10-16 MED ORDER — LACTATED RINGERS IV SOLN
INTRAVENOUS | Status: DC | PRN
Start: 2019-10-16 — End: 2019-10-16

## 2019-10-16 MED ORDER — EPHEDRINE SULFATE 50 MG/ML IJ SOLN
INTRAMUSCULAR | Status: DC | PRN
  Administered 2019-10-16: 10 mg via INTRAVENOUS
  Administered 2019-10-16: 5 mg via INTRAVENOUS

## 2019-10-16 MED ORDER — LACTATED RINGERS IV SOLN
Freq: Once | INTRAVENOUS | Status: AC
  Administered 2019-10-16: 1000 mL via INTRAVENOUS

## 2019-10-16 MED ORDER — HYDROMORPHONE HCL 1 MG/ML IJ SOLN: 0.2500 mg | INTRAMUSCULAR | Status: DC | PRN

## 2019-10-16 MED ORDER — CHLORHEXIDINE GLUCONATE 0.12 % MT SOLN
15.0000 mL | Freq: Once | OROMUCOSAL | Status: AC
  Administered 2019-10-16: 15 mL via OROMUCOSAL

## 2019-10-16 MED ORDER — ONDANSETRON HCL 4 MG/2ML IJ SOLN
INTRAMUSCULAR | Status: AC
  Filled 2019-10-16: qty 2

## 2019-10-16 MED ORDER — CEFAZOLIN SODIUM-DEXTROSE 2-4 GM/100ML-% IV SOLN
2.0000 g | INTRAVENOUS | Status: AC
  Administered 2019-10-16: 2 g via INTRAVENOUS
  Filled 2019-10-16: qty 100

## 2019-10-16 SURGICAL SUPPLY — 25 items
BAG DRAIN URO TABLE W/ADPT NS (BAG) ×3 IMPLANT
BAG DRN 8 ADPR NS SKTRN CSTL (BAG) ×1
BAG HAMPER (MISCELLANEOUS) ×3 IMPLANT
CATH INTERMIT  6FR 70CM (CATHETERS) ×3 IMPLANT
CLOTH BEACON ORANGE TIMEOUT ST (SAFETY) ×3 IMPLANT
DECANTER SPIKE VIAL GLASS SM (MISCELLANEOUS) ×3 IMPLANT
EXTRACTOR STONE NITINOL NGAGE (UROLOGICAL SUPPLIES) ×3 IMPLANT
FIBER LASER FLEXIVA 200 (UROLOGICAL SUPPLIES) ×3 IMPLANT
GLOVE BIO SURGEON STRL SZ8 (GLOVE) ×3 IMPLANT
GLOVE BIOGEL PI IND STRL 7.0 (GLOVE) ×3 IMPLANT
GLOVE BIOGEL PI INDICATOR 7.0 (GLOVE) ×6
GOWN STRL REUS W/TWL LRG LVL3 (GOWN DISPOSABLE) ×3 IMPLANT
GOWN STRL REUS W/TWL XL LVL3 (GOWN DISPOSABLE) ×3 IMPLANT
GUIDEWIRE STR DUAL SENSOR (WIRE) ×3 IMPLANT
GUIDEWIRE STR ZIPWIRE 035X150 (MISCELLANEOUS) ×3 IMPLANT
IV NS IRRIG 3000ML ARTHROMATIC (IV SOLUTION) ×6 IMPLANT
KIT TURNOVER CYSTO (KITS) ×3 IMPLANT
MANIFOLD NEPTUNE II (INSTRUMENTS) ×3 IMPLANT
PACK CYSTO (CUSTOM PROCEDURE TRAY) ×3 IMPLANT
PAD ARMBOARD 7.5X6 YLW CONV (MISCELLANEOUS) ×3 IMPLANT
STENT URET 6FRX24 CONTOUR (STENTS) ×3 IMPLANT
STENT URET 6FRX26 CONTOUR (STENTS) IMPLANT
SYR 10ML LL (SYRINGE) ×3 IMPLANT
TOWEL OR 17X26 4PK STRL BLUE (TOWEL DISPOSABLE) ×3 IMPLANT
WATER STERILE IRR 500ML POUR (IV SOLUTION) ×3 IMPLANT

## 2019-10-16 NOTE — H&P (View-Only) (Signed)
10/11/2019 7:44 AM   Heather Arnold 20-Mar-1961 [id number]  Referring provider: Sharilyn Sites, MD [address] [place],  [place] [address]  Left flank pain  HPI: Heather Arnold is a 59yo here for evaluation of nephrolithiasis. She developed left flank pain on 7/9 and presented to the ER on 7/10 and was diagnosed with a 22mm left proximal ureteral calculus. She has not passed her calculus. She has intermittent, sharp, moderate, nonraditing left flank pain with associated nausea but no vomiting. No LUTS. This is her first stone event. No hematuria. KUB from today did not show the calculus. Pain is 3/10 currently.   PMH: Past Medical History:  Diagnosis Date  . Arteriosclerotic coronary artery disease   . Hiatal hernia   . Kidney stones 10/16/2019  . Ovarian cyst     Surgical History: Past Surgical History:  Procedure Laterality Date  . CHOLECYSTECTOMY    . [place]  . THYROID SURGERY    . TUBAL LIGATION    . TUBAL LIGATION      Home Medications:  Allergies as of 10/11/2019   No Known Allergies     Medication List       Accurate as of October 11, 2019 11:59 PM. If you have any questions, ask your nurse or doctor.        alendronate 70 MG tablet Commonly known as: FOSAMAX Take 70 mg by mouth once a week.   Calcium Citrate-Vitamin D 315-250 MG-UNIT Tabs Take 1 tablet by mouth daily.   cephALEXin 500 MG capsule Commonly known as: KEFLEX Take 1 capsule (500 mg total) by mouth 4 (four) times daily.   cetirizine 10 MG tablet Commonly known as: ZYRTEC Take 10 mg by mouth daily.   HYDROcodone-acetaminophen 5-325 MG tablet Commonly known as: NORCO/VICODIN Take 1-2 tablets by mouth every 6 (six) hours as needed. What changed: reasons to take this   ondansetron 4 MG tablet Commonly known as: ZOFRAN Take 1 tablet (4 mg total) by mouth every 8 (eight) hours as needed for nausea or vomiting.   tamsulosin 0.4 MG Caps capsule Commonly known  as: FLOMAX Take 1 capsule (0.4 mg total) by mouth daily.       Allergies: No Known Allergies  Family History: Family History  Problem Relation Age of Onset  . Kidney cancer Father   . Heart failure Father     Social History:  reports that she has never smoked. She has never used smokeless tobacco. She reports that she does not drink alcohol and does not use drugs.  ROS: All other review of systems were reviewed and are negative except what is noted above in HPI  Physical Exam: BP (!) 128/55   Pulse 71   Temp (!) 97.2 F (36.2 C)   Ht 5\' 3"  (1.6 m)   Wt 130 lb (59 kg)   BMI 23.03 kg/m   Constitutional:  Alert and oriented, No acute distress. HEENT: [place] AT, moist mucus membranes.  Trachea midline, no masses. Cardiovascular: No clubbing, cyanosis, or edema. Respiratory: Normal respiratory effort, no increased work of breathing. GI: Abdomen is soft, nontender, nondistended, no abdominal masses GU: No CVA tenderness.  Lymph: No cervical or inguinal lymphadenopathy. Skin: No rashes, bruises or suspicious lesions. Neurologic: Grossly intact, no focal deficits, moving all 4 extremities. Psychiatric: Normal mood and affect.  Laboratory Data: Lab Results  Component Value Date   WBC 11.3 (H) 10/07/2019   HGB 13.4 10/07/2019   HCT 39.8 10/07/2019  MCV 91.1 10/07/2019   PLT 335 10/07/2019    Lab Results  Component Value Date   CREATININE 1.08 (H) 10/07/2019    No results found for: PSA  No results found for: TESTOSTERONE  No results found for: HGBA1C  Urinalysis    Component Value Date/Time   COLORURINE YELLOW 10/07/2019 0906   APPEARANCEUR CLOUDY (A) 10/07/2019 0906   LABSPEC 1.021 10/07/2019 0906   PHURINE 7.0 10/07/2019 0906   GLUCOSEU 50 (A) 10/07/2019 0906   HGBUR LARGE (A) 10/07/2019 0906   BILIRUBINUR neg 10/11/2019 0921   [place] 10/07/2019 0906   PROTEINUR Negative 10/11/2019 0921   PROTEINUR 30 (A) 10/07/2019 0906   UROBILINOGEN 0.2  10/11/2019 0921   NITRITE neg 10/11/2019 0921   NITRITE NEGATIVE 10/07/2019 0906   LEUKOCYTESUR Negative 10/11/2019 0921   LEUKOCYTESUR NEGATIVE 10/07/2019 0906    Lab Results  Component Value Date   BACTERIA NONE SEEN 10/07/2019    Pertinent Imaging: CT 7/10: Images reviewed and discussed with the patient No results found for this or any previous visit.  No results found for this or any previous visit.  No results found for this or any previous visit.  No results found for this or any previous visit.  No results found for this or any previous visit.  No results found for this or any previous visit.  No results found for this or any previous visit.  Results for orders placed during the hospital encounter of 10/07/19  CT Renal Stone Study  Narrative CLINICAL DATA:  Left-sided flank pain with nausea and vomiting  EXAM: CT ABDOMEN AND PELVIS WITHOUT CONTRAST  TECHNIQUE: Multidetector CT imaging of the abdomen and pelvis was performed following the standard protocol without oral or IV contrast.  COMPARISON:  None.  FINDINGS: Lower chest: Lung bases are clear.  There is a focal hiatal hernia.  Hepatobiliary: No focal liver lesions are appreciable on this noncontrast enhanced study. The gallbladder is absent. There is no appreciable biliary duct dilatation.  Pancreas: There is no pancreatic mass or inflammatory focus.  Spleen: No splenic lesions are evident.  Adrenals/Urinary Tract: Adrenals bilaterally appear normal. There is a cyst arising from the lateral upper right kidney measuring 1.8 x 1.8 cm. There is a cyst in the anterior upper left kidney measuring 1.7 x 1.7 cm. There is mild hydronephrosis on the left. There is no hydronephrosis on the right. There is no intrarenal calculus on either side. There is a 4 x 3 mm calculus in the proximal left ureter at the L3-4 level. No other ureteral calculi are appreciable. The urinary bladder is midline with wall  thickness within normal limits.  Stomach/Bowel: There is no appreciable bowel wall or mesenteric thickening. The terminal ileum appears normal. There is no evident bowel obstruction. There is no free air or portal venous air.  Vascular/Lymphatic: There is no abdominal aortic aneurysm. There are scattered foci of aortic atherosclerosis. There is no evident adenopathy in the abdomen or pelvis.  Reproductive: The uterus is anteverted. There is a cyst arising in the right adnexa measuring 3.8 x 3.4 cm. No other pelvic mass evident.  Other: Appendix appears normal. No abscess or ascites is evident in the abdomen or pelvis.  Musculoskeletal: No blastic or lytic bone lesions. No intramuscular or abdominal wall lesions are evident.  IMPRESSION: 1. 4 x 3 mm calculus in the proximal left ureter at the L3-4 level with mild hydronephrosis on the left.  2. Right ovarian/adnexal cyst measuring 3.8 x  3.4 cm. Simple cyst most likely etiology. No other pelvic mass.  3. No bowel obstruction. No abscess in the abdomen or pelvis. Appendix appears normal.  4.  Gallbladder absent.  5.  Focal hiatal hernia present.  6.  Aortic Atherosclerosis (ICD10-I70.0).   Electronically Signed By: Lowella Grip III M.D. On: 10/07/2019 09:28   Assessment & Plan:    1. Kidney stones -We discussed the management of kidney stones. These options include observation, ureteroscopy, shockwave lithotripsy (ESWL) and percutaneous nephrolithotomy (PCNL). We discussed which options are relevant to the patient's stone(s). We discussed the natural history of kidney stones as well as the complications of untreated stones and the impact on quality of life without treatment as well as with each of the above listed treatments. We also discussed the efficacy of each treatment in its ability to clear the stone burden. With any of these management options I discussed the signs and symptoms of infection and the need for  emergent treatment should these be experienced. For each option we discussed the ability of each procedure to clear the patient of their stone burden.   For observation I described the risks which include but are not limited to silent renal damage, life-threatening infection, need for emergent surgery, failure to pass stone and pain.   For ureteroscopy I described the risks which include bleeding, infection, damage to contiguous structures, positioning injury, ureteral stricture, ureteral avulsion, ureteral injury, need for prolonged ureteral stent, inability to perform ureteroscopy, need for an interval procedure, inability to clear stone burden, stent discomfort/pain, heart attack, stroke, pulmonary embolus and the inherent risks with general anesthesia.   For shockwave lithotripsy I described the risks which include arrhythmia, kidney contusion, kidney hemorrhage, need for transfusion, pain, inability to adequately break up stone, inability to pass stone fragments, [place], infection associated with obstructing stones, need for alternate surgical procedure, need for repeat shockwave lithotripsy, MI, CVA, PE and the inherent risks with anesthesia/conscious sedation.   For PCNL I described the risks including positioning injury, pneumothorax, hydrothorax, need for chest tube, inability to clear stone burden, renal laceration, arterial venous fistula or malformation, need for embolization of kidney, loss of kidney or renal function, need for repeat procedure, need for prolonged nephrostomy tube, ureteral avulsion, MI, CVA, PE and the inherent risks of general anesthesia.   - The patient would like to proceed with left ureteroscopic stone extraction - POCT urinalysis dipstick - Abdomen 1 view (KUB); Future   No follow-ups on file.  [name], MD  [facility] [place]

## 2019-10-16 NOTE — Discharge Instructions (Signed)
Ureteral Stent Implantation, Care After This sheet gives you information about how to care for yourself after your procedure. Your health care provider may also give you more specific instructions. If you have problems or questions, contact your health care provider. What can I expect after the procedure? After the procedure, it is common to have:  Nausea.  Mild pain when you urinate. You may feel this pain in your lower back or lower abdomen. The pain should stop within a few minutes after you urinate. This may last for up to 1 week.  A small amount of blood in your urine for several days. Follow these instructions at home: Medicines  Take over-the-counter and prescription medicines only as told by your health care provider.  If you were prescribed an antibiotic medicine, take it as told by your health care provider. Do not stop taking the antibiotic even if you start to feel better.  Do not drive for 24 hours if you were given a sedative during your procedure.  Ask your health care provider if the medicine prescribed to you requires you to avoid driving or using heavy machinery. Activity  Rest as told by your health care provider.  Avoid sitting for a long time without moving. Get up to take short walks every 1-2 hours. This is important to improve blood flow and breathing. Ask for help if you feel weak or unsteady.  Return to your normal activities as told by your health care provider. Ask your health care provider what activities are safe for you. General instructions   Watch for any blood in your urine. Call your health care provider if the amount of blood in your urine increases.  If you have a catheter: ? Follow instructions from your health care provider about taking care of your catheter and collection bag. ? Do not take baths, swim, or use a hot tub until your health care provider approves. Ask your health care provider if you may take showers. You may only be allowed to  take sponge baths.  Drink enough fluid to keep your urine pale yellow.  Do not use any products that contain nicotine or tobacco, such as cigarettes, e-cigarettes, and chewing tobacco. These can delay healing after surgery. If you need help quitting, ask your health care provider.  Keep all follow-up visits as told by your health care provider. This is important. Contact a health care provider if:  You have pain that gets worse or does not get better with medicine, especially pain when you urinate.  You have difficulty urinating.  You feel nauseous or you vomit repeatedly during a period of more than 2 days after the procedure. Get help right away if:  Your urine is dark red or has blood clots in it.  You are leaking urine (have incontinence).  The end of the stent comes out of your urethra.  You cannot urinate.  You have sudden, sharp, or severe pain in your abdomen or lower back.  You have a fever.  You have swelling or pain in your legs.  You have difficulty breathing. Summary  After the procedure, it is common to have mild pain when you urinate that goes away within a few minutes after you urinate. This may last for up to 1 week.  Watch for any blood in your urine. Call your health care provider if the amount of blood in your urine increases.  Take over-the-counter and prescription medicines only as told by your health care provider.  Drink   enough fluid to keep your urine pale yellow. This information is not intended to replace advice given to you by your health care provider. Make sure you discuss any questions you have with your health care provider. Document Revised: 12/21/2017 Document Reviewed: 12/22/2017 Elsevier Patient Education  2020 [employer]  General Anesthesia, Adult, Care After This sheet gives you information about how to care for yourself after your procedure. Your health care provider may also give you more specific instructions. If you have  problems or questions, contact your health care provider. What can I expect after the procedure? After the procedure, the following side effects are common:  Pain or discomfort at the IV site.  Nausea.  Vomiting.  Sore throat.  Trouble concentrating.  Feeling cold or chills.  Weak or tired.  Sleepiness and fatigue.  Soreness and body aches. These side effects can affect parts of the body that were not involved in surgery. Follow these instructions at home:  For at least 24 hours after the procedure:  Have a responsible adult stay with you. It is important to have someone help care for you until you are awake and alert.  Rest as needed.  Do not: ? Participate in activities in which you could fall or become injured. ? Drive. ? Use heavy machinery. ? Drink alcohol. ? Take sleeping pills or medicines that cause drowsiness. ? Make important decisions or sign legal documents. ? Take care of children on your own. Eating and drinking  Follow any instructions from your health care provider about eating or drinking restrictions.  When you feel hungry, start by eating small amounts of foods that are soft and easy to digest (bland), such as toast. Gradually return to your regular diet.  Drink enough fluid to keep your urine pale yellow.  If you vomit, rehydrate by drinking water, juice, or clear broth. General instructions  If you have sleep apnea, surgery and certain medicines can increase your risk for breathing problems. Follow instructions from your health care provider about wearing your sleep device: ? Anytime you are sleeping, including during daytime naps. ? While taking prescription pain medicines, sleeping medicines, or medicines that make you drowsy.  Return to your normal activities as told by your health care provider. Ask your health care provider what activities are safe for you.  Take over-the-counter and prescription medicines only as told by your health  care provider.  If you smoke, do not smoke without supervision.  Keep all follow-up visits as told by your health care provider. This is important. Contact a health care provider if:  You have nausea or vomiting that does not get better with medicine.  You cannot eat or drink without vomiting.  You have pain that does not get better with medicine.  You are unable to pass urine.  You develop a skin rash.  You have a fever.  You have redness around your IV site that gets worse. Get help right away if:  You have difficulty breathing.  You have chest pain.  You have blood in your urine or stool, or you vomit blood. Summary  After the procedure, it is common to have a sore throat or nausea. It is also common to feel tired.  Have a responsible adult stay with you for the first 24 hours after general anesthesia. It is important to have someone help care for you until you are awake and alert.  When you feel hungry, start by eating small amounts of foods that are soft   that are soft and easy to digest (bland), such as toast. Gradually return to your regular diet.  Drink enough fluid to keep your urine pale yellow.  Return to your normal activities as told by your health care provider. Ask your health care provider what activities are safe for you. This information is not intended to replace advice given to you by your health care provider. Make sure you discuss any questions you have with your health care provider. Document Revised: 03/19/2017 Document Reviewed: 10/30/2016 Elsevier Patient Education  [place].   \ Remove scopolamine patch behind right ear on Thursday October 19, 2019. Wash hands after removal

## 2019-10-16 NOTE — Anesthesia Postprocedure Evaluation (Signed)
Anesthesia Post Note  Patient: Heather Arnold  Procedure(s) Performed: CYSTOSCOPY WITH RETROGRADE PYELOGRAM, URETEROSCOPY AND STENT PLACEMENT (Left Ureter) CYSTOSCOPY WITH HOLMIUM LASER LITHOTRIPSY (Left Ureter)  Patient location during evaluation: PACU Anesthesia Type: General Level of consciousness: awake and alert and patient cooperative Pain management: satisfactory to patient Vital Signs Assessment: post-procedure vital signs reviewed and stable Respiratory status: spontaneous breathing Cardiovascular status: stable Postop Assessment: no apparent nausea or vomiting Anesthetic complications: no   No complications documented.   Last Vitals:  Vitals:   10/16/19 1030 10/16/19 1034  BP: 135/69 135/81  Pulse: 79 76  Resp: 12 18  Temp:  36.6 C  SpO2: 97% 100%    Last Pain:  Vitals:   10/16/19 1034  TempSrc: Oral  PainSc: 0-No pain                 Tonea Leiphart

## 2019-10-16 NOTE — Anesthesia Procedure Notes (Signed)
Procedure Name: LMA Insertion Date/Time: 10/16/2019 9:18 AM Performed by: Vista Deck, CRNA Pre-anesthesia Checklist: Patient identified, Patient being monitored, Emergency Drugs available, Timeout performed and Suction available Patient Re-evaluated:Patient Re-evaluated prior to induction Oxygen Delivery Method: Circle System Utilized Preoxygenation: Pre-oxygenation with 100% oxygen Induction Type: IV induction Ventilation: Mask ventilation without difficulty LMA: LMA inserted LMA Size: 4.0 Number of attempts: 1 Placement Confirmation: positive ETCO2 and breath sounds checked- equal and bilateral Tube secured with: Tape Dental Injury: Teeth and Oropharynx as per pre-operative assessment

## 2019-10-16 NOTE — Op Note (Signed)
.  Preoperative diagnosis: Left ureteral stone  Postoperative diagnosis: Same  Procedure: 1 cystoscopy 2. Left retrograde pyelography 3.  Intraoperative fluoroscopy, under one hour, with interpretation 4.  Left ureteroscopic stone manipulation with laser lithotripsy 5.  Left 6 x 24 JJ stent placement  Attending: Rosie Fate  Anesthesia: General  Estimated blood loss: None  Drains: Left 6 x 24 JJ ureteral stent without tether  Specimens: stone for analysis  Antibiotics: ancef  Findings: left mid ureteral stone. Moderate hydronephrosis. No masses/lesions in the bladder. Ureteral orifices in normal anatomic location.  Indications: Patient is a 59 year old female with a history of left ureteral stone and who has persistent left flank pain.  After discussing treatment options, she decided proceed with left ureteroscopic stone manipulation.  Procedure in detail: The patient was brought to the operating room and a brief timeout was done to ensure correct patient, correct procedure, correct site.  General anesthesia was administered patient was placed in dorsal lithotomy position.  Her genitalia was then prepped and draped in usual sterile fashion.  A rigid 58 French cystoscope was passed in the urethra and the bladder.  Bladder was inspected free masses or lesions.  the ureteral orifices were in the normal orthotopic locations.  a 6 french ureteral catheter was then instilled into the left ureteral orifice.  a gentle retrograde was obtained and findings noted above.  we then placed a zip wire through the ureteral catheter and advanced up to the renal pelvis.  we then removed the cystoscope and cannulated the left ureteral orifice with a semirigid ureteroscope.  We located a calculus in the mid ureter. Using a 200 nm laser fiber and fragmented the stone into smaller pieces and the fragments were then removed with a Ngage basket.  once all stone fragments were removed we then placed a 6 x 24  double-j ureteral stent over the original zip wire. We then removed the wire and good coil was noted in the the renal pelvis under fluoroscopy and the bladder under direct vision.     the stone fragments were then removed from the bladder and sent for analysis.   the bladder was then drained and this concluded the procedure which was well tolerated by patient.  Complications: None  Condition: Stable, extubated, transferred to PACU  Plan: Patient is to be discharged home as to follow-up in one week for stent removal.

## 2019-10-16 NOTE — Transfer of Care (Signed)
Immediate Anesthesia Transfer of Care Note  Patient: Heather Arnold  Procedure(s) Performed: CYSTOSCOPY WITH RETROGRADE PYELOGRAM, URETEROSCOPY AND STENT PLACEMENT (Left Ureter) CYSTOSCOPY WITH HOLMIUM LASER LITHOTRIPSY (Left Ureter)  Patient Location: PACU  Anesthesia Type:General  Level of Consciousness: awake and patient cooperative  Airway & Oxygen Therapy: Patient Spontanous Breathing  Post-op Assessment: Report given to RN and Post -op Vital signs reviewed and stable  Post vital signs: Reviewed and stable  Last Vitals:  Vitals Value Taken Time  BP 149/60   Temp 97.5   Pulse 99 10/16/19 1005  Resp 18 10/16/19 1005  SpO2 98 % 10/16/19 1005  Vitals shown include unvalidated device data.  Last Pain:  Vitals:   10/16/19 0723  TempSrc: Oral  PainSc: 0-No pain      Patients Stated Pain Goal: 4 (96/92/49 [id number])  Complications: No complications documented.

## 2019-10-16 NOTE — Patient Instructions (Signed)

## 2019-10-16 NOTE — Progress Notes (Signed)
10/11/2019 7:44 AM   Heather Arnold [birth year] [id number]  Referring provider: Sharilyn Sites, MD [address] [place],  [place] [address]  Left flank pain  HPI: Ms Heather Arnold is a 59yo here for evaluation of nephrolithiasis. She developed left flank pain on 7/9 and presented to the ER on 7/10 and was diagnosed with a 59mm left proximal ureteral calculus. She has not passed her calculus. She has intermittent, sharp, moderate, nonraditing left flank pain with associated nausea but no vomiting. No LUTS. This is her first stone event. No hematuria. KUB from today did not show the calculus. Pain is 3/10 currently.   PMH: Past Medical History:  Diagnosis Date  . Arteriosclerotic coronary artery disease   . Hiatal hernia   . Kidney stones 10/16/2019  . Ovarian cyst     Surgical History: Past Surgical History:  Procedure Laterality Date  . CHOLECYSTECTOMY    . [place]  . THYROID SURGERY    . TUBAL LIGATION    . TUBAL LIGATION      Home Medications:  Allergies as of 10/11/2019   No Known Allergies     Medication List       Accurate as of October 11, 2019 11:59 PM. If you have any questions, ask your nurse or doctor.        alendronate 70 MG tablet Commonly known as: FOSAMAX Take 70 mg by mouth once a week.   Calcium Citrate-Vitamin D 315-250 MG-UNIT Tabs Take 1 tablet by mouth daily.   cephALEXin 500 MG capsule Commonly known as: KEFLEX Take 1 capsule (500 mg total) by mouth 4 (four) times daily.   cetirizine 10 MG tablet Commonly known as: ZYRTEC Take 10 mg by mouth daily.   HYDROcodone-acetaminophen 5-325 MG tablet Commonly known as: NORCO/VICODIN Take 1-2 tablets by mouth every 6 (six) hours as needed. What changed: reasons to take this   ondansetron 4 MG tablet Commonly known as: ZOFRAN Take 1 tablet (4 mg total) by mouth every 8 (eight) hours as needed for nausea or vomiting.   tamsulosin 0.4 MG Caps capsule Commonly known  as: FLOMAX Take 1 capsule (0.4 mg total) by mouth daily.       Allergies: No Known Allergies  Family History: Family History  Problem Relation Age of Onset  . Kidney cancer Father   . Heart failure Father     Social History:  reports that she has never smoked. She has never used smokeless tobacco. She reports that she does not drink alcohol and does not use drugs.  ROS: All other review of systems were reviewed and are negative except what is noted above in HPI  Physical Exam: BP (!) 128/55   Pulse 71   Temp (!) 97.2 F (36.2 C)   Ht 5\' 3"  (1.6 m)   Wt 130 lb (59 kg)   BMI 23.03 kg/m   Constitutional:  Alert and oriented, No acute distress. HEENT: Heather Arnold AT, moist mucus membranes.  Trachea midline, no masses. Cardiovascular: No clubbing, cyanosis, or edema. Respiratory: Normal respiratory effort, no increased work of breathing. GI: Abdomen is soft, nontender, nondistended, no abdominal masses GU: No CVA tenderness.  Lymph: No cervical or inguinal lymphadenopathy. Skin: No rashes, bruises or suspicious lesions. Neurologic: Grossly intact, no focal deficits, moving all 4 extremities. Psychiatric: Normal mood and affect.  Laboratory Data: Lab Results  Component Value Date   WBC 11.3 (H) 10/07/2019   HGB 13.4 10/07/2019   HCT 39.8 10/07/2019  MCV 91.1 10/07/2019   PLT 335 10/07/2019    Lab Results  Component Value Date   CREATININE 1.08 (H) 10/07/2019    No results found for: PSA  No results found for: TESTOSTERONE  No results found for: HGBA1C  Urinalysis    Component Value Date/Time   COLORURINE YELLOW 10/07/2019 0906   APPEARANCEUR CLOUDY (A) 10/07/2019 0906   LABSPEC 1.021 10/07/2019 0906   PHURINE 7.0 10/07/2019 0906   GLUCOSEU 50 (A) 10/07/2019 0906   HGBUR LARGE (A) 10/07/2019 0906   BILIRUBINUR neg 10/11/2019 0921   [place] 10/07/2019 0906   PROTEINUR Negative 10/11/2019 0921   PROTEINUR 30 (A) 10/07/2019 0906   UROBILINOGEN 0.2  10/11/2019 0921   NITRITE neg 10/11/2019 0921   NITRITE NEGATIVE 10/07/2019 0906   LEUKOCYTESUR Negative 10/11/2019 0921   LEUKOCYTESUR NEGATIVE 10/07/2019 0906    Lab Results  Component Value Date   BACTERIA NONE SEEN 10/07/2019    Pertinent Imaging: CT 7/10: Images reviewed and discussed with the patient No results found for this or any previous visit.  No results found for this or any previous visit.  No results found for this or any previous visit.  No results found for this or any previous visit.  No results found for this or any previous visit.  No results found for this or any previous visit.  No results found for this or any previous visit.  Results for orders placed during the hospital encounter of 10/07/19  CT Renal Stone Study  Narrative CLINICAL DATA:  Left-sided flank pain with nausea and vomiting  EXAM: CT ABDOMEN AND PELVIS WITHOUT CONTRAST  TECHNIQUE: Multidetector CT imaging of the abdomen and pelvis was performed following the standard protocol without oral or IV contrast.  COMPARISON:  None.  FINDINGS: Lower chest: Lung bases are clear.  There is a focal hiatal hernia.  Hepatobiliary: No focal liver lesions are appreciable on this noncontrast enhanced study. The gallbladder is absent. There is no appreciable biliary duct dilatation.  Pancreas: There is no pancreatic mass or inflammatory focus.  Spleen: No splenic lesions are evident.  Adrenals/Urinary Tract: Adrenals bilaterally appear normal. There is a cyst arising from the lateral upper right kidney measuring 1.8 x 1.8 cm. There is a cyst in the anterior upper left kidney measuring 1.7 x 1.7 cm. There is mild hydronephrosis on the left. There is no hydronephrosis on the right. There is no intrarenal calculus on either side. There is a 4 x 3 mm calculus in the proximal left ureter at the L3-4 level. No other ureteral calculi are appreciable. The urinary bladder is midline with wall  thickness within normal limits.  Stomach/Bowel: There is no appreciable bowel wall or mesenteric thickening. The terminal ileum appears normal. There is no evident bowel obstruction. There is no free air or portal venous air.  Vascular/Lymphatic: There is no abdominal aortic aneurysm. There are scattered foci of aortic atherosclerosis. There is no evident adenopathy in the abdomen or pelvis.  Reproductive: The uterus is anteverted. There is a cyst arising in the right adnexa measuring 3.8 x 3.4 cm. No other pelvic mass evident.  Other: Appendix appears normal. No abscess or ascites is evident in the abdomen or pelvis.  Musculoskeletal: No blastic or lytic bone lesions. No intramuscular or abdominal wall lesions are evident.  IMPRESSION: 1. 4 x 3 mm calculus in the proximal left ureter at the L3-4 level with mild hydronephrosis on the left.  2. Right ovarian/adnexal cyst measuring 3.8 x  3.4 cm. Simple cyst most likely etiology. No other pelvic mass.  3. No bowel obstruction. No abscess in the abdomen or pelvis. Appendix appears normal.  4.  Gallbladder absent.  5.  Focal hiatal hernia present.  6.  Aortic Atherosclerosis (ICD10-I70.0).   Electronically Signed By: Lowella Grip III M.D. On: 10/07/2019 09:28   Assessment & Plan:    1. Kidney stones -We discussed the management of kidney stones. These options include observation, ureteroscopy, shockwave lithotripsy (ESWL) and percutaneous nephrolithotomy (PCNL). We discussed which options are relevant to the patient's stone(s). We discussed the natural history of kidney stones as well as the complications of untreated stones and the impact on quality of life without treatment as well as with each of the above listed treatments. We also discussed the efficacy of each treatment in its ability to clear the stone burden. With any of these management options I discussed the signs and symptoms of infection and the need for  emergent treatment should these be experienced. For each option we discussed the ability of each procedure to clear the patient of their stone burden.   For observation I described the risks which include but are not limited to silent renal damage, life-threatening infection, need for emergent surgery, failure to pass stone and pain.   For ureteroscopy I described the risks which include bleeding, infection, damage to contiguous structures, positioning injury, ureteral stricture, ureteral avulsion, ureteral injury, need for prolonged ureteral stent, inability to perform ureteroscopy, need for an interval procedure, inability to clear stone burden, stent discomfort/pain, heart attack, stroke, pulmonary embolus and the inherent risks with general anesthesia.   For shockwave lithotripsy I described the risks which include arrhythmia, kidney contusion, kidney hemorrhage, need for transfusion, pain, inability to adequately break up stone, inability to pass stone fragments, [place], infection associated with obstructing stones, need for alternate surgical procedure, need for repeat shockwave lithotripsy, MI, CVA, PE and the inherent risks with anesthesia/conscious sedation.   For PCNL I described the risks including positioning injury, pneumothorax, hydrothorax, need for chest tube, inability to clear stone burden, renal laceration, arterial venous fistula or malformation, need for embolization of kidney, loss of kidney or renal function, need for repeat procedure, need for prolonged nephrostomy tube, ureteral avulsion, MI, CVA, PE and the inherent risks of general anesthesia.   - The patient would like to proceed with left ureteroscopic stone extraction - POCT urinalysis dipstick - Abdomen 1 view (KUB); Future   No follow-ups on file.  [name], MD  [facility] [place]

## 2019-10-16 NOTE — Anesthesia Preprocedure Evaluation (Signed)
Anesthesia Evaluation  Patient identified by MRN, date of birth, ID band Patient awake    Reviewed: Allergy & Precautions, NPO status , Patient's Chart, lab work & pertinent test results  History of Anesthesia Complications (+) PONV and history of anesthetic complications  Airway Mallampati: II  TM Distance: >3 FB Neck ROM: Full    Dental  (+) Teeth Intact, Dental Advisory Given   Pulmonary neg pulmonary ROS,    Pulmonary exam normal breath sounds clear to auscultation       Cardiovascular Exercise Tolerance: Good + CAD  Normal cardiovascular exam Rhythm:Regular Rate:Normal     Neuro/Psych negative neurological ROS  negative psych ROS   GI/Hepatic Neg liver ROS, hiatal hernia,   Endo/Other  negative endocrine ROS  Renal/GU negative Renal ROS   Renal stones    Musculoskeletal negative musculoskeletal ROS (+)   Abdominal   Peds negative pediatric ROS (+)  Hematology negative hematology ROS (+)   Anesthesia Other Findings   Reproductive/Obstetrics negative OB ROS                             Anesthesia Physical Anesthesia Plan  ASA: II  Anesthesia Plan: General   Post-op Pain Management:    Induction: Intravenous  PONV Risk Score and Plan: 4 or greater and Ondansetron, Dexamethasone, Midazolam and Scopolamine patch - Pre-op  Airway Management Planned: LMA  Additional Equipment:   Intra-op Plan:   Post-operative Plan: Extubation in OR  Informed Consent: I have reviewed the patients History and Physical, chart, labs and discussed the procedure including the risks, benefits and alternatives for the proposed anesthesia with the patient or authorized representative who has indicated his/her understanding and acceptance.     Dental advisory given  Plan Discussed with: CRNA and Surgeon  Anesthesia Plan Comments:         Anesthesia Quick Evaluation

## 2019-10-17 ENCOUNTER — Encounter (HOSPITAL_COMMUNITY): Payer: Self-pay | Admitting: Urology

## 2019-10-23 DIAGNOSIS — N83201 Unspecified ovarian cyst, right side: Secondary | ICD-10-CM | POA: Diagnosis not present

## 2019-10-24 NOTE — Interval H&P Note (Signed)
History and Physical Interval Note:  10/24/2019 8:40 AM  Heather Arnold  has presented today for surgery, with the diagnosis of left ureteral calculus.  The various methods of treatment have been discussed with the patient and family. After consideration of risks, benefits and other options for treatment, the patient has consented to  Procedure(s) with comments: CYSTOSCOPY WITH RETROGRADE PYELOGRAM, URETEROSCOPY AND STENT PLACEMENT (Left) - pt knows to arrive at 7:00 [place] LITHOTRIPSY (Left) as a surgical intervention.  The patient's history has been reviewed, patient examined, no change in status, stable for surgery.  I have reviewed the patient's chart and labs.  Questions were answered to the patient's satisfaction.     [name]

## 2019-10-25 ENCOUNTER — Encounter: Payer: Self-pay | Admitting: Urology

## 2019-10-25 ENCOUNTER — Other Ambulatory Visit: Payer: Self-pay

## 2019-10-25 ENCOUNTER — Ambulatory Visit (INDEPENDENT_AMBULATORY_CARE_PROVIDER_SITE_OTHER): Payer: 59 | Admitting: Urology

## 2019-10-25 VITALS — BP 132/51 | HR 77 | Temp 97.9°F

## 2019-10-25 DIAGNOSIS — N200 Calculus of kidney: Secondary | ICD-10-CM | POA: Diagnosis not present

## 2019-10-25 LAB — CALCULI, WITH PHOTOGRAPH (CLINICAL LAB)
Calcium Oxalate Dihydrate: 50 %
Calcium Oxalate Monohydrate: 30 %
Hydroxyapatite: 20 %
Weight Calculi: 11 mg

## 2019-10-25 LAB — MICROSCOPIC EXAMINATION
RBC, Urine: >30 /hpf — AB (ref 0–2)
Renal Epithel, UA: NONE SEEN /hpf

## 2019-10-25 LAB — URINALYSIS, ROUTINE W REFLEX MICROSCOPIC
Bilirubin, UA: NEGATIVE
Glucose, UA: NEGATIVE
Leukocytes,UA: NEGATIVE
Nitrite, UA: NEGATIVE
Specific Gravity, UA: >1.03 — ABNORMAL HIGH (ref 1.005–1.030)
Urobilinogen, Ur: 0.2 mg/dL (ref 0.2–1.0)
pH, UA: 5.5 (ref 5.0–7.5)

## 2019-10-25 MED ORDER — CIPROFLOXACIN HCL 500 MG PO TABS
500.0000 mg | ORAL_TABLET | Freq: Once | ORAL | Status: AC
  Administered 2019-10-25: 500 mg via ORAL

## 2019-10-25 NOTE — Progress Notes (Signed)
   10/25/19  CC: nephrolithiasis  HPI: Ms Cashwell is a 59yo her for stent removal after left ureteroscopic stone extraction  Blood pressure (!) 132/51, pulse 77, temperature 97.9 F (36.6 C). NED. A&Ox3.   No respiratory distress   Abd soft, NT, ND Normal external genitalia with patent urethral meatus  Cystoscopy Procedure Note  Patient identification was confirmed, informed consent was obtained, and patient was prepped using Betadine solution.  Lidocaine jelly was administered per urethral meatus.    Procedure: - Flexible cystoscope introduced, without any difficulty.   - Thorough search of the bladder revealed:    normal urethral meatus    normal urothelium    no stones    no ulcers     no tumors    no urethral polyps    no trabeculation  - Ureteral orifices were normal in position and appearance.  Using a grasper the left ureteral stent was removed intact  Post-Procedure: - Patient tolerated the procedure well  Assessment/ Plan:    Return in about 6 weeks (around 12/06/2019) for renal US.  [name], MD

## 2019-10-25 NOTE — Patient Instructions (Signed)
Dietary Guidelines to Help Prevent Kidney Stones Kidney stones are deposits of minerals and salts that form inside your kidneys. Your risk of developing kidney stones may be greater depending on your diet, your lifestyle, the medicines you take, and whether you have certain medical conditions. Most people can reduce their chances of developing kidney stones by following the instructions below. Depending on your overall health and the type of kidney stones you tend to develop, your dietitian may give you more specific instructions. What are tips for following this plan? Reading food labels  Choose foods with "no salt added" or "low-salt" labels. Limit your sodium intake to less than 1500 mg per day.  Choose foods with calcium for each meal and snack. Try to eat about 300 mg of calcium at each meal. Foods that contain 200-500 mg of calcium per serving include: ? 8 oz (237 ml) of milk, fortified nondairy milk, and fortified fruit juice. ? 8 oz (237 ml) of kefir, yogurt, and soy yogurt. ? 4 oz (118 ml) of tofu. ? 1 oz of cheese. ? 1 cup (300 g) of dried figs. ? 1 cup (91 g) of cooked broccoli. ? 1-3 oz can of sardines or mackerel.  Most people need 1000 to 1500 mg of calcium each day. Talk to your dietitian about how much calcium is recommended for you. Shopping  Buy plenty of fresh fruits and vegetables. Most people do not need to avoid fruits and vegetables, even if they contain nutrients that may contribute to kidney stones.  When shopping for convenience foods, choose: ? Whole pieces of fruit. ? Premade salads with dressing on the side. ? Low-fat fruit and yogurt smoothies.  Avoid buying frozen meals or prepared deli foods.  Look for foods with live cultures, such as yogurt and kefir. Cooking  Do not add salt to food when cooking. Place a salt shaker on the table and allow each person to add his or her own salt to taste.  Use vegetable protein, such as beans, textured vegetable  protein (TVP), or tofu instead of meat in pasta, casseroles, and soups. Meal planning   Eat less salt, if told by your dietitian. To do this: ? Avoid eating processed or premade food. ? Avoid eating fast food.  Eat less animal protein, including cheese, meat, poultry, or fish, if told by your dietitian. To do this: ? Limit the number of times you have meat, poultry, fish, or cheese each week. Eat a diet free of meat at least 2 days a week. ? Eat only one serving each day of meat, poultry, fish, or seafood. ? When you prepare animal protein, cut pieces into small portion sizes. For most meat and fish, one serving is about the size of one deck of cards.  Eat at least 5 servings of fresh fruits and vegetables each day. To do this: ? Keep fruits and vegetables on hand for snacks. ? Eat 1 piece of fruit or a handful of berries with breakfast. ? Have a salad and fruit at lunch. ? Have two kinds of vegetables at dinner.  Limit foods that are high in a substance called oxalate. These include: ? Spinach. ? Rhubarb. ? Beets. ? Potato chips and french fries. ? Nuts.  If you regularly take a diuretic medicine, make sure to eat at least 1-2 fruits or vegetables high in potassium each day. These include: ? Avocado. ? Banana. ? Orange, prune, carrot, or tomato juice. ? Baked potato. ? Cabbage. ? Beans and split   peas. General instructions   Drink enough fluid to keep your urine clear or pale yellow. This is the most important thing you can do.  Talk to your health care provider and dietitian about taking daily supplements. Depending on your health and the cause of your kidney stones, you may be advised: ? Not to take supplements with vitamin C. ? To take a calcium supplement. ? To take a daily probiotic supplement. ? To take other supplements such as magnesium, fish oil, or vitamin B6.  Take all medicines and supplements as told by your health care provider.  Limit alcohol intake to no  more than 1 drink a day for nonpregnant women and 2 drinks a day for men. One drink equals 12 oz of beer, 5 oz of wine, or 1 oz of hard liquor.  Lose weight if told by your health care provider. Work with your dietitian to find strategies and an eating plan that works best for you. What foods are not recommended? Limit your intake of the following foods, or as told by your dietitian. Talk to your dietitian about specific foods you should avoid based on the type of kidney stones and your overall health. Grains Breads. Bagels. Rolls. Baked goods. Salted crackers. Cereal. Pasta. Vegetables Spinach. Rhubarb. Beets. Canned vegetables. Pickles. Olives. Meats and other protein foods Nuts. Nut butters. Large portions of meat, poultry, or fish. Salted or cured meats. Deli meats. Hot dogs. Sausages. Dairy Cheese. Beverages Regular soft drinks. Regular vegetable juice. Seasonings and other foods Seasoning blends with salt. Salad dressings. Canned soups. Soy sauce. Ketchup. Barbecue sauce. Canned pasta sauce. Casseroles. Pizza. Lasagna. Frozen meals. Potato chips. French fries. Summary  You can reduce your risk of kidney stones by making changes to your diet.  The most important thing you can do is drink enough fluid. You should drink enough fluid to keep your urine clear or pale yellow.  Ask your health care provider or dietitian how much protein from animal sources you should eat each day, and also how much salt and calcium you should have each day. This information is not intended to replace advice given to you by your health care provider. Make sure you discuss any questions you have with your health care provider. Document Revised: 07/06/2018 Document Reviewed: 02/25/2016 Elsevier Patient Education  2020 [employer]  

## 2019-10-25 NOTE — Progress Notes (Signed)

## 2019-10-27 ENCOUNTER — Telehealth: Payer: Self-pay

## 2019-10-27 NOTE — Telephone Encounter (Signed)
Message sent to MD for bladder pressure per pt after cysto stent removal on 7/28

## 2019-10-30 NOTE — Telephone Encounter (Signed)
Please have her take AZO

## 2019-10-30 NOTE — Telephone Encounter (Signed)
Pt.notified

## 2019-11-13 ENCOUNTER — Other Ambulatory Visit: Payer: Self-pay

## 2019-11-13 ENCOUNTER — Ambulatory Visit (HOSPITAL_COMMUNITY)
Admission: RE | Admit: 2019-11-13 | Discharge: 2019-11-13 | Disposition: A | Discharge status: Home / Self Care | Payer: 59 | Source: Ambulatory Visit | Attending: Urology | Admitting: Urology

## 2019-11-13 DIAGNOSIS — N200 Calculus of kidney: Secondary | ICD-10-CM | POA: Insufficient documentation

## 2019-11-13 DIAGNOSIS — N281 Cyst of kidney, acquired: Secondary | ICD-10-CM | POA: Diagnosis not present

## 2019-11-15 NOTE — Progress Notes (Signed)
Letter sent via my chart

## 2019-11-18 ENCOUNTER — Ambulatory Visit
Admission: EM | Admit: 2019-11-18 | Discharge: 2019-11-18 | Disposition: A | Discharge status: Home / Self Care | Payer: 59 | Attending: Family Medicine | Admitting: Family Medicine

## 2019-11-18 DIAGNOSIS — H6982 Other specified disorders of Eustachian tube, left ear: Secondary | ICD-10-CM

## 2019-11-18 DIAGNOSIS — H6992 Unspecified Eustachian tube disorder, left ear: Secondary | ICD-10-CM

## 2019-11-18 MED ORDER — MECLIZINE HCL 12.5 MG PO TABS
12.5000 mg | ORAL_TABLET | Freq: Three times a day (TID) | ORAL | 0 refills | Status: DC | PRN
Start: 2019-11-18 — End: 2023-03-08

## 2019-11-18 MED ORDER — FLUTICASONE PROPIONATE 50 MCG/ACT NA SUSP
2.0000 | Freq: Every day | NASAL | 2 refills | Status: DC
Start: 2019-11-18 — End: 2024-02-16

## 2019-11-18 NOTE — Discharge Instructions (Signed)
Take the Flonase daily.  Continue your cetirizine daily.  Meclizine as needed for severe dizziness, nausea Follow up as needed for continued or worsening symptoms

## 2019-11-18 NOTE — ED Triage Notes (Signed)
Pt has c/o right ear pain and fullness foe past few days

## 2019-11-20 NOTE — ED Provider Notes (Signed)
[facility]    CSN: [id number] Arrival date & time: 11/18/19  1418      History   Chief Complaint No chief complaint on file.   HPI Heather Arnold is a 59 y.o. female.   Patient is a 59-year-old female presents today for right ear pain and fullness for the past 2 days.  Symptoms have been constant.  Denies any associated nasal congestion, rhinorrhea, cough, fevers.  She is also had some mild dizziness.  Denies any headache, blurred vision.     Past Medical History:  Diagnosis Date  . Arteriosclerotic coronary artery disease   . Hiatal hernia   . Kidney stones 10/16/2019  . Ovarian cyst     Patient Active Problem List   Diagnosis Date Noted  . Kidney stones 10/16/2019  . Hypokalemia 01/16/2011  . Bradycardia 01/16/2011  . Chest pain 01/15/2011  . Vomiting 01/15/2011  . Heartburn 01/15/2011    Past Surgical History:  Procedure Laterality Date  . CHOLECYSTECTOMY    . CYSTOSCOPY WITH HOLMIUM LASER LITHOTRIPSY Left 10/16/2019   Procedure: CYSTOSCOPY WITH HOLMIUM LASER LITHOTRIPSY;  Surgeon: Cleon Gustin, MD;  Location: [facility];  Service: Urology;  Laterality: Left;  . CYSTOSCOPY WITH RETROGRADE PYELOGRAM, URETEROSCOPY AND STENT PLACEMENT Left 10/16/2019   Procedure: CYSTOSCOPY WITH RETROGRADE PYELOGRAM, URETEROSCOPY AND STENT PLACEMENT;  Surgeon: Cleon Gustin, MD;  Location: [facility];  Service: Urology;  Laterality: Left;  pt knows to arrive at 7:00  . [place]  . THYROID SURGERY    . TUBAL LIGATION    . TUBAL LIGATION      OB History   No obstetric history on file.      Home Medications    Prior to Admission medications   Medication Sig Start Date End Date Taking? Authorizing Provider  alendronate (FOSAMAX) 70 MG tablet Take 70 mg by mouth once a week. 05/27/19   [provider]  Calcium Citrate-Vitamin D 315-250 MG-UNIT TABS Take 1 tablet by mouth daily.    [provider]  cetirizine  (ZYRTEC) 10 MG tablet Take 10 mg by mouth daily.    [provider]  fluticasone (FLONASE) 50 MCG/ACT nasal spray Place 2 sprays into both nostrils daily. 11/18/19   Loura Halt A, NP  meclizine (ANTIVERT) 12.5 MG tablet Take 1 tablet (12.5 mg total) by mouth 3 (three) times daily as needed for dizziness. 11/18/19   Orvan July, NP    Family History Family History  Problem Relation Age of Onset  . Kidney cancer Father   . Heart failure Father     Social History Social History   Tobacco Use  . Smoking status: Never Smoker  . Smokeless tobacco: Never Used  Substance Use Topics  . Alcohol use: No  . Drug use: No     Allergies   Patient has no known allergies.   Review of Systems Review of Systems   Physical Exam Triage Vital Signs ED Triage Vitals  Enc Vitals Group     BP 11/18/19 1437 131/68     Pulse Rate 11/18/19 1437 70     Resp 11/18/19 1437 17     Temp 11/18/19 1437 98.2 F (36.8 C)     Temp Source 11/18/19 1437 Oral     SpO2 11/18/19 1437 96 %     Weight --      Height --      Head Circumference --  Peak Flow --      Pain Score 11/18/19 1449 5     Pain Loc --      Pain Edu? --      Excl. in [place]? --    No data found.  Updated Vital Signs BP 131/68 (BP Location: Right Arm)   Pulse 70   Temp 98.2 F (36.8 C) (Oral)   Resp 17   SpO2 96%   Visual Acuity Right Eye Distance:   Left Eye Distance:   Bilateral Distance:    Right Eye Near:   Left Eye Near:    Bilateral Near:     Physical Exam Vitals and nursing note reviewed.  Constitutional:      General: She is not in acute distress.    Appearance: Normal appearance. She is not ill-appearing, toxic-appearing or diaphoretic.  HENT:     Head: Normocephalic.     Right Ear: Tympanic membrane and ear canal normal.     Left Ear: Tympanic membrane and ear canal normal.     Nose: Nose normal.     Mouth/Throat:     Comments: PND  Eyes:     Conjunctiva/sclera: Conjunctivae normal.    Pulmonary:     Effort: Pulmonary effort is normal.  Musculoskeletal:        General: Normal range of motion.     Cervical back: Normal range of motion.  Skin:    General: Skin is warm and dry.     Findings: No rash.  Neurological:     Mental Status: She is alert.  Psychiatric:        Mood and Affect: Mood normal.      UC Treatments / Results  Labs (all labs ordered are listed, but only abnormal results are displayed) Labs Reviewed - No data to display  EKG   Radiology No results found.  Procedures Procedures (including critical care time)  Medications Ordered in UC Medications - No data to display  Initial Impression / Assessment and Plan / UC Course  I have reviewed the triage vital signs and the nursing notes.  Pertinent labs & imaging results that were available during my care of the patient were reviewed by me and considered in my medical decision making (see chart for details).     Left eustachian tube dysfunction.  Recommended Flonase and Zyrtec daily.  Meclizine as needed for severe dizziness and nausea.  Follow-up as needed for continued symptoms Final Clinical Impressions(s) / UC Diagnoses   Final diagnoses:  Dysfunction of left eustachian tube     Discharge Instructions     Take the Flonase daily.  Continue your cetirizine daily.  Meclizine as needed for severe dizziness, nausea Follow up as needed for continued or worsening symptoms     ED Prescriptions    Medication Sig Dispense Auth. Provider   fluticasone (FLONASE) 50 MCG/ACT nasal spray Place 2 sprays into both nostrils daily. 16 g Manual Navarra A, NP   meclizine (ANTIVERT) 12.5 MG tablet Take 1 tablet (12.5 mg total) by mouth 3 (three) times daily as needed for dizziness. 30 tablet Loura Halt A, NP     PDMP not reviewed this encounter.   Orvan July, NP 11/20/19 1134

## 2019-12-05 ENCOUNTER — Ambulatory Visit (INDEPENDENT_AMBULATORY_CARE_PROVIDER_SITE_OTHER): Payer: 59 | Admitting: Urology

## 2019-12-05 ENCOUNTER — Encounter: Payer: Self-pay | Admitting: Urology

## 2019-12-05 ENCOUNTER — Other Ambulatory Visit: Payer: Self-pay

## 2019-12-05 VITALS — BP 115/60 | HR 62 | Temp 98.0°F | Ht 63.0 in | Wt 130.0 lb

## 2019-12-05 DIAGNOSIS — N200 Calculus of kidney: Secondary | ICD-10-CM

## 2019-12-05 DIAGNOSIS — N83201 Unspecified ovarian cyst, right side: Secondary | ICD-10-CM | POA: Diagnosis not present

## 2019-12-05 LAB — URINALYSIS, ROUTINE W REFLEX MICROSCOPIC
Bilirubin, UA: NEGATIVE
Glucose, UA: NEGATIVE
Ketones, UA: NEGATIVE
Leukocytes,UA: NEGATIVE
Nitrite, UA: NEGATIVE
Protein,UA: NEGATIVE
Specific Gravity, UA: 1.02 (ref 1.005–1.030)
Urobilinogen, Ur: 0.2 mg/dL (ref 0.2–1.0)
pH, UA: 7.5 (ref 5.0–7.5)

## 2019-12-05 LAB — MICROSCOPIC EXAMINATION: Renal Epithel, UA: NONE SEEN /hpf

## 2019-12-05 NOTE — Patient Instructions (Signed)
Dietary Guidelines to Help Prevent Kidney Stones Kidney stones are deposits of minerals and salts that form inside your kidneys. Your risk of developing kidney stones may be greater depending on your diet, your lifestyle, the medicines you take, and whether you have certain medical conditions. Most people can reduce their chances of developing kidney stones by following the instructions below. Depending on your overall health and the type of kidney stones you tend to develop, your dietitian may give you more specific instructions. What are tips for following this plan? Reading food labels  Choose foods with "no salt added" or "low-salt" labels. Limit your sodium intake to less than 1500 mg per day.  Choose foods with calcium for each meal and snack. Try to eat about 300 mg of calcium at each meal. Foods that contain 200-500 mg of calcium per serving include: ? 8 oz (237 ml) of milk, fortified nondairy milk, and fortified fruit juice. ? 8 oz (237 ml) of kefir, yogurt, and soy yogurt. ? 4 oz (118 ml) of tofu. ? 1 oz of cheese. ? 1 cup (300 g) of dried figs. ? 1 cup (91 g) of cooked broccoli. ? 1-3 oz can of sardines or mackerel.  Most people need 1000 to 1500 mg of calcium each day. Talk to your dietitian about how much calcium is recommended for you. Shopping  Buy plenty of fresh fruits and vegetables. Most people do not need to avoid fruits and vegetables, even if they contain nutrients that may contribute to kidney stones.  When shopping for convenience foods, choose: ? Whole pieces of fruit. ? Premade salads with dressing on the side. ? Low-fat fruit and yogurt smoothies.  Avoid buying frozen meals or prepared deli foods.  Look for foods with live cultures, such as yogurt and kefir. Cooking  Do not add salt to food when cooking. Place a salt shaker on the table and allow each person to add his or her own salt to taste.  Use vegetable protein, such as beans, textured vegetable  protein (TVP), or tofu instead of meat in pasta, casseroles, and soups. Meal planning   Eat less salt, if told by your dietitian. To do this: ? Avoid eating processed or premade food. ? Avoid eating fast food.  Eat less animal protein, including cheese, meat, poultry, or fish, if told by your dietitian. To do this: ? Limit the number of times you have meat, poultry, fish, or cheese each week. Eat a diet free of meat at least 2 days a week. ? Eat only one serving each day of meat, poultry, fish, or seafood. ? When you prepare animal protein, cut pieces into small portion sizes. For most meat and fish, one serving is about the size of one deck of cards.  Eat at least 5 servings of fresh fruits and vegetables each day. To do this: ? Keep fruits and vegetables on hand for snacks. ? Eat 1 piece of fruit or a handful of berries with breakfast. ? Have a salad and fruit at lunch. ? Have two kinds of vegetables at dinner.  Limit foods that are high in a substance called oxalate. These include: ? Spinach. ? Rhubarb. ? Beets. ? Potato chips and french fries. ? Nuts.  If you regularly take a diuretic medicine, make sure to eat at least 1-2 fruits or vegetables high in potassium each day. These include: ? Avocado. ? Banana. ? Orange, prune, carrot, or tomato juice. ? Baked potato. ? Cabbage. ? Beans and split   peas. General instructions   Drink enough fluid to keep your urine clear or pale yellow. This is the most important thing you can do.  Talk to your health care provider and dietitian about taking daily supplements. Depending on your health and the cause of your kidney stones, you may be advised: ? Not to take supplements with vitamin C. ? To take a calcium supplement. ? To take a daily probiotic supplement. ? To take other supplements such as magnesium, fish oil, or vitamin B6.  Take all medicines and supplements as told by your health care provider.  Limit alcohol intake to no  more than 1 drink a day for nonpregnant women and 2 drinks a day for men. One drink equals 12 oz of beer, 5 oz of wine, or 1 oz of hard liquor.  Lose weight if told by your health care provider. Work with your dietitian to find strategies and an eating plan that works best for you. What foods are not recommended? Limit your intake of the following foods, or as told by your dietitian. Talk to your dietitian about specific foods you should avoid based on the type of kidney stones and your overall health. Grains Breads. Bagels. Rolls. Baked goods. Salted crackers. Cereal. Pasta. Vegetables Spinach. Rhubarb. Beets. Canned vegetables. Pickles. Olives. Meats and other protein foods Nuts. Nut butters. Large portions of meat, poultry, or fish. Salted or cured meats. Deli meats. Hot dogs. Sausages. Dairy Cheese. Beverages Regular soft drinks. Regular vegetable juice. Seasonings and other foods Seasoning blends with salt. Salad dressings. Canned soups. Soy sauce. Ketchup. Barbecue sauce. Canned pasta sauce. Casseroles. Pizza. Lasagna. Frozen meals. Potato chips. French fries. Summary  You can reduce your risk of kidney stones by making changes to your diet.  The most important thing you can do is drink enough fluid. You should drink enough fluid to keep your urine clear or pale yellow.  Ask your health care provider or dietitian how much protein from animal sources you should eat each day, and also how much salt and calcium you should have each day. This information is not intended to replace advice given to you by your health care provider. Make sure you discuss any questions you have with your health care provider. Document Revised: 07/06/2018 Document Reviewed: 02/25/2016 Elsevier Patient Education  2020 [employer]  

## 2019-12-05 NOTE — Progress Notes (Signed)

## 2019-12-05 NOTE — Progress Notes (Signed)
12/05/2019 1:49 PM   Heather Arnold December 09, 1960 [id number]  Referring provider: Sharilyn Sites, MD [address] [place] [address]  nephrolithiasis  HPI: Heather Arnold is a 59yo here for followup for nephrolithiasis. No stone events since last. Renal US shows no calculi. No LUTS. She drinks 40oz of water daily   PMH: Past Medical History:  Diagnosis Date  . Arteriosclerotic coronary artery disease   . Hiatal hernia   . Kidney stones 10/16/2019  . Ovarian cyst     Surgical History: Past Surgical History:  Procedure Laterality Date  . CHOLECYSTECTOMY    . CYSTOSCOPY WITH HOLMIUM LASER LITHOTRIPSY Left 10/16/2019   Procedure: CYSTOSCOPY WITH HOLMIUM LASER LITHOTRIPSY;  Surgeon: Cleon Gustin, MD;  Location: [facility];  Service: Urology;  Laterality: Left;  . CYSTOSCOPY WITH RETROGRADE PYELOGRAM, URETEROSCOPY AND STENT PLACEMENT Left 10/16/2019   Procedure: CYSTOSCOPY WITH RETROGRADE PYELOGRAM, URETEROSCOPY AND STENT PLACEMENT;  Surgeon: Cleon Gustin, MD;  Location: [facility];  Service: Urology;  Laterality: Left;  pt knows to arrive at 7:00  . [place]  . THYROID SURGERY    . TUBAL LIGATION    . TUBAL LIGATION      Home Medications:  Allergies as of 12/05/2019   No Known Allergies     Medication List       Accurate as of December 05, 2019  1:49 PM. If you have any questions, ask your nurse or doctor.        alendronate 70 MG tablet Commonly known as: FOSAMAX Take 70 mg by mouth once a week.   Calcium Citrate-Vitamin D 315-250 MG-UNIT Tabs Take 1 tablet by mouth daily.   cetirizine 10 MG tablet Commonly known as: ZYRTEC Take 10 mg by mouth daily.   fluticasone 50 MCG/ACT nasal spray Commonly known as: FLONASE Place 2 sprays into both nostrils daily.   meclizine 12.5 MG tablet Commonly known as: ANTIVERT Take 1 tablet (12.5 mg total) by mouth 3 (three) times daily as needed for dizziness.       Allergies: No  Known Allergies  Family History: Family History  Problem Relation Age of Onset  . Kidney cancer Father   . Heart failure Father     Social History:  reports that she has never smoked. She has never used smokeless tobacco. She reports that she does not drink alcohol and does not use drugs.  ROS: All other review of systems were reviewed and are negative except what is noted above in HPI  Physical Exam: BP 115/60   Pulse 62   Temp 98 F (36.7 C)   Ht 5\' 3"  (1.6 m)   Wt 130 lb (59 kg)   BMI 23.03 kg/m   Constitutional:  Alert and oriented, No acute distress. HEENT: [place] AT, moist mucus membranes.  Trachea midline, no masses. Cardiovascular: No clubbing, cyanosis, or edema. Respiratory: Normal respiratory effort, no increased work of breathing. GI: Abdomen is soft, nontender, nondistended, no abdominal masses GU: No CVA tenderness.  Lymph: No cervical or inguinal lymphadenopathy. Skin: No rashes, bruises or suspicious lesions. Neurologic: Grossly intact, no focal deficits, moving all 4 extremities. Psychiatric: Normal mood and affect.  Laboratory Data: Lab Results  Component Value Date   WBC 11.3 (H) 10/07/2019   HGB 13.4 10/07/2019   HCT 39.8 10/07/2019   MCV 91.1 10/07/2019   PLT 335 10/07/2019    Lab Results  Component Value Date   CREATININE 1.08 (H) 10/07/2019  No results found for: PSA  No results found for: TESTOSTERONE  No results found for: HGBA1C  Urinalysis    Component Value Date/Time   COLORURINE YELLOW 10/07/2019 0906   APPEARANCEUR Cloudy (A) 10/25/2019 0852   LABSPEC 1.021 10/07/2019 0906   PHURINE 7.0 10/07/2019 0906   GLUCOSEU Negative 10/25/2019 0852   HGBUR LARGE (A) 10/07/2019 0906   BILIRUBINUR Negative 10/25/2019 0852   KETONESUR NEGATIVE 10/07/2019 0906   PROTEINUR 3+ (A) 10/25/2019 0852   PROTEINUR 30 (A) 10/07/2019 0906   UROBILINOGEN 0.2 10/11/2019 0921   NITRITE Negative 10/25/2019 0852   NITRITE NEGATIVE 10/07/2019 0906    LEUKOCYTESUR Negative 10/25/2019 0852   LEUKOCYTESUR NEGATIVE 10/07/2019 0906    Lab Results  Component Value Date   LABMICR See below: 10/25/2019   WBCUA 0-5 10/25/2019   LABEPIT 0-10 10/25/2019   MUCUS Present 10/25/2019   BACTERIA Few 10/25/2019    Pertinent Imaging: Renal US 11/14/2019: Images reviewed and discussed with the patient Results for orders placed during the hospital encounter of 10/11/19  Abdomen 1 view (KUB)  Narrative CLINICAL DATA:  Nephrolithiasis with pain and hematuria  EXAM: ABDOMEN - 1 VIEW  COMPARISON:  Renal ultrasound October 07, 2019  FINDINGS: There are small probable phleboliths in the pelvis. There is no other abnormal calcification. There are surgical clips scattered throughout the abdomen and pelvis. There is moderate stool in the colon. No appreciable bowel dilatation or air-fluid level to suggest bowel obstruction. No free air.  IMPRESSION: Probable phleboliths in the pelvis. No bowel obstruction or free air.  There are clips in the gallbladder fossa region. There are apparent tubal ligation clips in the left pelvis as well as clips in the right lower abdomen and left upper pelvis. Clip on the left is in the expected location of the fallopian tubes. The clip on the right may well have become dislodged.  These results will be called to the ordering clinician or representative by the Radiologist Assistant, and communication documented in the PACS or [employer].   Electronically Signed By: Lowella Grip III M.D. On: 10/11/2019 15:18  No results found for this or any previous visit.  No results found for this or any previous visit.  No results found for this or any previous visit.  Results for orders placed during the hospital encounter of 11/13/19  Ultrasound renal complete  Narrative CLINICAL DATA:  Nephrolithiasis, history of cystoscopy with ureteral stenting in July 2021  EXAM: RENAL / URINARY TRACT  ULTRASOUND COMPLETE  COMPARISON:  CT abdomen and pelvis 10/07/2019  FINDINGS: Right Kidney:  Renal measurements: 9.8 x 4.1 x 5.6 cm = volume: 117 mL. Small exophytic simple cyst 1.7 x 2.0 x 1.9 cm. No additional mass or hydronephrosis.  Left Kidney:  Renal measurements: 9.2 x 4.7 x 4.6 cm = volume: 103 mL. Normal cortical thickness and echogenicity. Peripelvic cyst at upper pole 2.3 x 1.5 x 1.7 cm, simple in character. No additional mass or hydronephrosis.  Bladder:  Appears normal for degree of bladder distention.  Other:  No shadowing calculi are seen in either kidney.  IMPRESSION: BILATERAL renal cysts as above.  Remainder of exam unremarkable.   Electronically Signed By: Lavonia Dana M.D. On: 11/14/2019 08:16  No results found for this or any previous visit.  No results found for this or any previous visit.  Results for orders placed during the hospital encounter of 10/07/19  CT Renal Stone Study  Narrative CLINICAL DATA:  Left-sided flank pain  with nausea and vomiting  EXAM: CT ABDOMEN AND PELVIS WITHOUT CONTRAST  TECHNIQUE: Multidetector CT imaging of the abdomen and pelvis was performed following the standard protocol without oral or IV contrast.  COMPARISON:  None.  FINDINGS: Lower chest: Lung bases are clear.  There is a focal hiatal hernia.  Hepatobiliary: No focal liver lesions are appreciable on this noncontrast enhanced study. The gallbladder is absent. There is no appreciable biliary duct dilatation.  Pancreas: There is no pancreatic mass or inflammatory focus.  Spleen: No splenic lesions are evident.  Adrenals/Urinary Tract: Adrenals bilaterally appear normal. There is a cyst arising from the lateral upper right kidney measuring 1.8 x 1.8 cm. There is a cyst in the anterior upper left kidney measuring 1.7 x 1.7 cm. There is mild hydronephrosis on the left. There is no hydronephrosis on the right. There is no intrarenal calculus  on either side. There is a 4 x 3 mm calculus in the proximal left ureter at the L3-4 level. No other ureteral calculi are appreciable. The urinary bladder is midline with wall thickness within normal limits.  Stomach/Bowel: There is no appreciable bowel wall or mesenteric thickening. The terminal ileum appears normal. There is no evident bowel obstruction. There is no free air or portal venous air.  Vascular/Lymphatic: There is no abdominal aortic aneurysm. There are scattered foci of aortic atherosclerosis. There is no evident adenopathy in the abdomen or pelvis.  Reproductive: The uterus is anteverted. There is a cyst arising in the right adnexa measuring 3.8 x 3.4 cm. No other pelvic mass evident.  Other: Appendix appears normal. No abscess or ascites is evident in the abdomen or pelvis.  Musculoskeletal: No blastic or lytic bone lesions. No intramuscular or abdominal wall lesions are evident.  IMPRESSION: 1. 4 x 3 mm calculus in the proximal left ureter at the L3-4 level with mild hydronephrosis on the left.  2. Right ovarian/adnexal cyst measuring 3.8 x 3.4 cm. Simple cyst most likely etiology. No other pelvic mass.  3. No bowel obstruction. No abscess in the abdomen or pelvis. Appendix appears normal.  4.  Gallbladder absent.  5.  Focal hiatal hernia present.  6.  Aortic Atherosclerosis (ICD10-I70.0).   Electronically Signed By: Lowella Grip III M.D. On: 10/07/2019 09:28   Assessment & Plan:    1. Kidney stones -RTC 6 months with renal US -dietary handout given - Urinalysis, Routine w reflex microscopic   No follow-ups on file.  [name], MD  [facility] [place]

## 2020-01-10 ENCOUNTER — Ambulatory Visit: Payer: 59 | Admitting: Urology

## 2020-01-11 ENCOUNTER — Ambulatory Visit: Payer: 59 | Admitting: Urology

## 2020-02-06 ENCOUNTER — Other Ambulatory Visit (HOSPITAL_COMMUNITY): Payer: Self-pay | Admitting: Family Medicine

## 2020-02-06 ENCOUNTER — Other Ambulatory Visit (HOSPITAL_COMMUNITY): Payer: Self-pay | Admitting: Obstetrics and Gynecology

## 2020-02-06 DIAGNOSIS — Z1231 Encounter for screening mammogram for malignant neoplasm of breast: Secondary | ICD-10-CM

## 2020-02-16 ENCOUNTER — Other Ambulatory Visit: Payer: Self-pay

## 2020-02-16 ENCOUNTER — Ambulatory Visit (HOSPITAL_COMMUNITY)
Admission: RE | Admit: 2020-02-16 | Discharge: 2020-02-16 | Disposition: A | Discharge status: Home / Self Care | Payer: 59 | Source: Ambulatory Visit | Attending: Obstetrics and Gynecology | Admitting: Obstetrics and Gynecology

## 2020-02-16 DIAGNOSIS — Z1231 Encounter for screening mammogram for malignant neoplasm of breast: Secondary | ICD-10-CM | POA: Diagnosis not present

## 2020-05-13 DIAGNOSIS — Z01419 Encounter for gynecological examination (general) (routine) without abnormal findings: Secondary | ICD-10-CM | POA: Diagnosis not present

## 2020-06-05 ENCOUNTER — Ambulatory Visit: Payer: 59 | Admitting: Urology

## 2020-06-17 DIAGNOSIS — Z01419 Encounter for gynecological examination (general) (routine) without abnormal findings: Secondary | ICD-10-CM | POA: Diagnosis not present

## 2020-06-17 DIAGNOSIS — Z6822 Body mass index (BMI) 22.0-22.9, adult: Secondary | ICD-10-CM | POA: Diagnosis not present

## 2020-06-17 DIAGNOSIS — N83209 Unspecified ovarian cyst, unspecified side: Secondary | ICD-10-CM | POA: Diagnosis not present

## 2020-07-24 DIAGNOSIS — X32XXXA Exposure to sunlight, initial encounter: Secondary | ICD-10-CM | POA: Diagnosis not present

## 2020-07-24 DIAGNOSIS — L570 Actinic keratosis: Secondary | ICD-10-CM | POA: Diagnosis not present

## 2020-08-28 ENCOUNTER — Other Ambulatory Visit: Payer: Self-pay

## 2020-08-28 ENCOUNTER — Ambulatory Visit: Payer: 59 | Admitting: Urology

## 2020-08-28 ENCOUNTER — Encounter: Payer: Self-pay | Admitting: Urology

## 2020-08-28 VITALS — BP 113/61 | HR 72 | Ht 63.0 in | Wt 126.8 lb

## 2020-08-28 DIAGNOSIS — N200 Calculus of kidney: Secondary | ICD-10-CM

## 2020-08-28 DIAGNOSIS — M25552 Pain in left hip: Secondary | ICD-10-CM | POA: Diagnosis not present

## 2020-08-28 LAB — URINALYSIS, ROUTINE W REFLEX MICROSCOPIC
Bilirubin, UA: NEGATIVE
Glucose, UA: NEGATIVE
Leukocytes,UA: NEGATIVE
Nitrite, UA: NEGATIVE
Protein,UA: NEGATIVE
RBC, UA: NEGATIVE
Specific Gravity, UA: 1.02 (ref 1.005–1.030)
Urobilinogen, Ur: 2 mg/dL — ABNORMAL HIGH (ref 0.2–1.0)
pH, UA: 7.5 (ref 5.0–7.5)

## 2020-08-28 NOTE — Progress Notes (Signed)
08/28/2020 2:50 PM   Heather Arnold Feb 23, 1961 [id number]  Referring provider: Sharilyn Sites, MD [address] [place],  [place] [address]  Nephrolithiasis  HPI: Ms Kapfer is a 60yo here for followup for nephrolithiasis. No flank pain. No stone events since last visit. She drinks 60oz of water daily   PMH: Past Medical History:  Diagnosis Date  . Arteriosclerotic coronary artery disease   . Hiatal hernia   . Kidney stones 10/16/2019  . Ovarian cyst     Surgical History: Past Surgical History:  Procedure Laterality Date  . CHOLECYSTECTOMY    . CYSTOSCOPY WITH HOLMIUM LASER LITHOTRIPSY Left 10/16/2019   Procedure: CYSTOSCOPY WITH HOLMIUM LASER LITHOTRIPSY;  Surgeon: Cleon Gustin, MD;  Location: [facility];  Service: Urology;  Laterality: Left;  . CYSTOSCOPY WITH RETROGRADE PYELOGRAM, URETEROSCOPY AND STENT PLACEMENT Left 10/16/2019   Procedure: CYSTOSCOPY WITH RETROGRADE PYELOGRAM, URETEROSCOPY AND STENT PLACEMENT;  Surgeon: Cleon Gustin, MD;  Location: [facility];  Service: Urology;  Laterality: Left;  pt knows to arrive at 7:00  . [place]  . THYROID SURGERY    . TUBAL LIGATION    . TUBAL LIGATION      Home Medications:  Allergies as of 08/28/2020   No Known Allergies     Medication List       Accurate as of August 28, 2020  2:50 PM. If you have any questions, ask your nurse or doctor.        alendronate 70 MG tablet Commonly known as: FOSAMAX Take 70 mg by mouth once a week.   Calcium Citrate-Vitamin D 315-250 MG-UNIT Tabs Take 1 tablet by mouth daily.   cetirizine 10 MG tablet Commonly known as: ZYRTEC Take 10 mg by mouth daily.   fluticasone 50 MCG/ACT nasal spray Commonly known as: FLONASE Place 2 sprays into both nostrils daily.   HYDROcodone-acetaminophen 5-325 MG tablet Commonly known as: NORCO/VICODIN hydrocodone 5 mg-acetaminophen 325 mg tablet  TAKE 1 TO 2 TABLETS BY MOUTH EVERY 6 HOURS AS NEEDED    meclizine 12.5 MG tablet Commonly known as: ANTIVERT Take 1 tablet (12.5 mg total) by mouth 3 (three) times daily as needed for dizziness.       Allergies: No Known Allergies  Family History: Family History  Problem Relation Age of Onset  . Kidney cancer Father   . Heart failure Father     Social History:  reports that she has never smoked. She has never used smokeless tobacco. She reports that she does not drink alcohol and does not use drugs.  ROS: All other review of systems were reviewed and are negative except what is noted above in HPI  Physical Exam: BP 113/61   Pulse 72   Ht 5\' 3"  (1.6 m)   Wt 126 lb 12.8 oz (57.5 kg)   BMI 22.46 kg/m   Constitutional:  Alert and oriented, No acute distress. HEENT: [place] AT, moist mucus membranes.  Trachea midline, no masses. Cardiovascular: No clubbing, cyanosis, or edema. Respiratory: Normal respiratory effort, no increased work of breathing. GI: Abdomen is soft, nontender, nondistended, no abdominal masses GU: No CVA tenderness.  Lymph: No cervical or inguinal lymphadenopathy. Skin: No rashes, bruises or suspicious lesions. Neurologic: Grossly intact, no focal deficits, moving all 4 extremities. Psychiatric: Normal mood and affect.  Laboratory Data: Lab Results  Component Value Date   WBC 11.3 (H) 10/07/2019   HGB 13.4 10/07/2019   HCT 39.8 10/07/2019   MCV 91.1 10/07/2019  PLT 335 10/07/2019    Lab Results  Component Value Date   CREATININE 1.08 (H) 10/07/2019    No results found for: PSA  No results found for: TESTOSTERONE  No results found for: HGBA1C  Urinalysis    Component Value Date/Time   COLORURINE YELLOW 10/07/2019 0906   APPEARANCEUR Clear 12/05/2019 1330   LABSPEC 1.021 10/07/2019 0906   PHURINE 7.0 10/07/2019 0906   GLUCOSEU Negative 12/05/2019 1330   HGBUR LARGE (A) 10/07/2019 0906   BILIRUBINUR Negative 12/05/2019 1330   KETONESUR NEGATIVE 10/07/2019 0906   PROTEINUR Negative 12/05/2019  1330   PROTEINUR 30 (A) 10/07/2019 0906   UROBILINOGEN 0.2 10/11/2019 0921   NITRITE Negative 12/05/2019 1330   NITRITE NEGATIVE 10/07/2019 0906   LEUKOCYTESUR Negative 12/05/2019 1330   LEUKOCYTESUR NEGATIVE 10/07/2019 0906    Lab Results  Component Value Date   LABMICR See below: 12/05/2019   WBCUA 0-5 12/05/2019   LABEPIT 0-10 12/05/2019   MUCUS Present 10/25/2019   BACTERIA Few 12/05/2019    Pertinent Imaging:  Results for orders placed during the hospital encounter of 10/11/19  Abdomen 1 view (KUB)  Narrative CLINICAL DATA:  Nephrolithiasis with pain and hematuria  EXAM: ABDOMEN - 1 VIEW  COMPARISON:  Renal ultrasound October 07, 2019  FINDINGS: There are small probable phleboliths in the pelvis. There is no other abnormal calcification. There are surgical clips scattered throughout the abdomen and pelvis. There is moderate stool in the colon. No appreciable bowel dilatation or air-fluid level to suggest bowel obstruction. No free air.  IMPRESSION: Probable phleboliths in the pelvis. No bowel obstruction or free air.  There are clips in the gallbladder fossa region. There are apparent tubal ligation clips in the left pelvis as well as clips in the right lower abdomen and left upper pelvis. Clip on the left is in the expected location of the fallopian tubes. The clip on the right may well have become dislodged.  These results will be called to the ordering clinician or representative by the Radiologist Assistant, and communication documented in the PACS or [employer].   Electronically Signed By: Lowella Grip III M.D. On: 10/11/2019 15:18  No results found for this or any previous visit.  No results found for this or any previous visit.  No results found for this or any previous visit.  Results for orders placed during the hospital encounter of 11/13/19  Ultrasound renal complete  Narrative CLINICAL DATA:  Nephrolithiasis, history of  cystoscopy with ureteral stenting in July 2021  EXAM: RENAL / URINARY TRACT ULTRASOUND COMPLETE  COMPARISON:  CT abdomen and pelvis 10/07/2019  FINDINGS: Right Kidney:  Renal measurements: 9.8 x 4.1 x 5.6 cm = volume: 117 mL. Small exophytic simple cyst 1.7 x 2.0 x 1.9 cm. No additional mass or hydronephrosis.  Left Kidney:  Renal measurements: 9.2 x 4.7 x 4.6 cm = volume: 103 mL. Normal cortical thickness and echogenicity. Peripelvic cyst at upper pole 2.3 x 1.5 x 1.7 cm, simple in character. No additional mass or hydronephrosis.  Bladder:  Appears normal for degree of bladder distention.  Other:  No shadowing calculi are seen in either kidney.  IMPRESSION: BILATERAL renal cysts as above.  Remainder of exam unremarkable.   Electronically Signed By: Lavonia Dana M.D. On: 11/14/2019 08:16  No results found for this or any previous visit.  No results found for this or any previous visit.  Results for orders placed during the hospital encounter of 10/07/19  CT Renal  Stone Study  Narrative CLINICAL DATA:  Left-sided flank pain with nausea and vomiting  EXAM: CT ABDOMEN AND PELVIS WITHOUT CONTRAST  TECHNIQUE: Multidetector CT imaging of the abdomen and pelvis was performed following the standard protocol without oral or IV contrast.  COMPARISON:  None.  FINDINGS: Lower chest: Lung bases are clear.  There is a focal hiatal hernia.  Hepatobiliary: No focal liver lesions are appreciable on this noncontrast enhanced study. The gallbladder is absent. There is no appreciable biliary duct dilatation.  Pancreas: There is no pancreatic mass or inflammatory focus.  Spleen: No splenic lesions are evident.  Adrenals/Urinary Tract: Adrenals bilaterally appear normal. There is a cyst arising from the lateral upper right kidney measuring 1.8 x 1.8 cm. There is a cyst in the anterior upper left kidney measuring 1.7 x 1.7 cm. There is mild hydronephrosis on the  left. There is no hydronephrosis on the right. There is no intrarenal calculus on either side. There is a 4 x 3 mm calculus in the proximal left ureter at the L3-4 level. No other ureteral calculi are appreciable. The urinary bladder is midline with wall thickness within normal limits.  Stomach/Bowel: There is no appreciable bowel wall or mesenteric thickening. The terminal ileum appears normal. There is no evident bowel obstruction. There is no free air or portal venous air.  Vascular/Lymphatic: There is no abdominal aortic aneurysm. There are scattered foci of aortic atherosclerosis. There is no evident adenopathy in the abdomen or pelvis.  Reproductive: The uterus is anteverted. There is a cyst arising in the right adnexa measuring 3.8 x 3.4 cm. No other pelvic mass evident.  Other: Appendix appears normal. No abscess or ascites is evident in the abdomen or pelvis.  Musculoskeletal: No blastic or lytic bone lesions. No intramuscular or abdominal wall lesions are evident.  IMPRESSION: 1. 4 x 3 mm calculus in the proximal left ureter at the L3-4 level with mild hydronephrosis on the left.  2. Right ovarian/adnexal cyst measuring 3.8 x 3.4 cm. Simple cyst most likely etiology. No other pelvic mass.  3. No bowel obstruction. No abscess in the abdomen or pelvis. Appendix appears normal.  4.  Gallbladder absent.  5.  Focal hiatal hernia present.  6.  Aortic Atherosclerosis (ICD10-I70.0).   Electronically Signed By: Lowella Grip III M.D. On: 10/07/2019 09:28   Assessment & Plan:    1. Kidney stones -RTC 1 year with renal US - Urinalysis, Routine w reflex microscopic   No follow-ups on file.  [name], MD  [facility] [place]

## 2020-08-28 NOTE — Progress Notes (Signed)
Urological Symptom Review  Patient is experiencing the following symptoms: Kidney Stones   Review of Systems  Gastrointestinal (upper)  : Negative for upper GI symptoms  Gastrointestinal (lower) : Negative for lower GI symptoms  Constitutional : Negative for symptoms  Skin: Negative for skin symptoms  Eyes: Negative for eye symptoms  Ear/Nose/Throat : Negative for Ear/Nose/Throat symptoms  Hematologic/Lymphatic: Negative for Hematologic/Lymphatic symptoms  Cardiovascular : Negative for cardiovascular symptoms  Respiratory : Negative for respiratory symptoms  Endocrine: Negative for endocrine symptoms  Musculoskeletal: Negative for musculoskeletal symptoms  Neurological: Negative for neurological symptoms  Psychologic: Negative for psychiatric symptoms

## 2020-08-28 NOTE — Patient Instructions (Signed)

## 2020-09-09 DIAGNOSIS — M1612 Unilateral primary osteoarthritis, left hip: Secondary | ICD-10-CM | POA: Diagnosis not present

## 2020-12-16 DIAGNOSIS — N83201 Unspecified ovarian cyst, right side: Secondary | ICD-10-CM | POA: Diagnosis not present

## 2020-12-16 DIAGNOSIS — N83209 Unspecified ovarian cyst, unspecified side: Secondary | ICD-10-CM | POA: Diagnosis not present

## 2021-02-07 ENCOUNTER — Other Ambulatory Visit (HOSPITAL_COMMUNITY): Payer: Self-pay | Admitting: Obstetrics and Gynecology

## 2021-02-07 DIAGNOSIS — Z1231 Encounter for screening mammogram for malignant neoplasm of breast: Secondary | ICD-10-CM

## 2021-02-19 ENCOUNTER — Ambulatory Visit (HOSPITAL_COMMUNITY)
Admission: RE | Admit: 2021-02-19 | Discharge: 2021-02-19 | Disposition: A | Discharge status: Home / Self Care | Payer: 59 | Source: Ambulatory Visit | Attending: Obstetrics and Gynecology | Admitting: Obstetrics and Gynecology

## 2021-02-19 ENCOUNTER — Other Ambulatory Visit: Payer: Self-pay

## 2021-02-19 DIAGNOSIS — Z1231 Encounter for screening mammogram for malignant neoplasm of breast: Secondary | ICD-10-CM | POA: Diagnosis not present

## 2021-06-07 IMAGING — DX DG ABDOMEN 1V
1 series · 1 of 1 positions shown · non-contrast
Comparison: Renal ultrasound October 07, 2019

CLINICAL DATA: Nephrolithiasis with pain and hematuria

EXAM:
ABDOMEN - 1 VIEW

[abdomen kub]
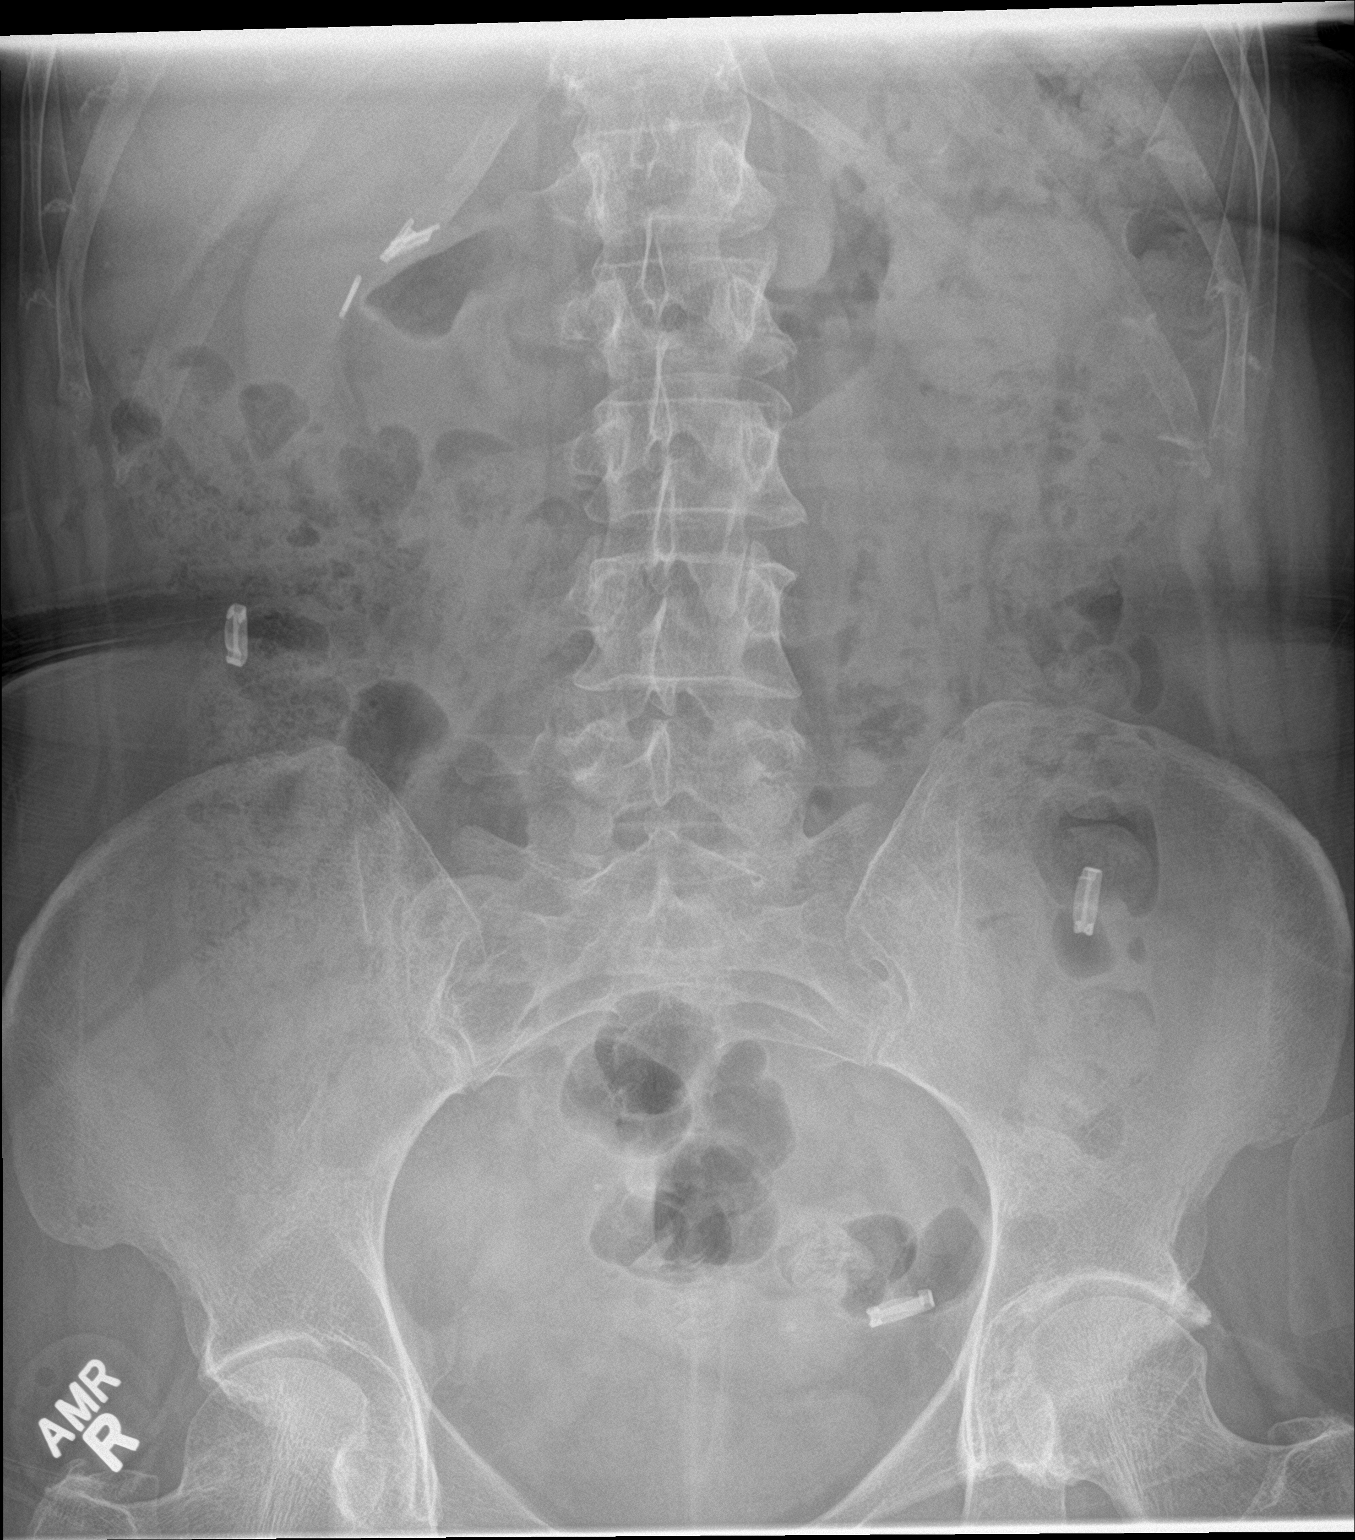

[1 of 1 positions shown; findings below may reference images not displayed]

FINDINGS: There are small probable phleboliths in the pelvis. There is no
other abnormal calcification. There are surgical clips scattered
throughout the abdomen and pelvis. There is moderate stool in the
colon. No appreciable bowel dilatation or air-fluid level to suggest
bowel obstruction. No free air.
IMPRESSION: Probable phleboliths in the pelvis. No bowel obstruction or free
air.

There are clips in the gallbladder fossa region. There are apparent
tubal ligation clips in the left pelvis as well as clips in the
right lower abdomen and left upper pelvis. Clip on the left is in
the expected location of the fallopian tubes. The clip on the right
may well have become dislodged.

These results will be called to the ordering clinician or
representative by the Radiologist Assistant, and communication
documented in the PACS or [REDACTED].

## 2021-06-16 DIAGNOSIS — L659 Nonscarring hair loss, unspecified: Secondary | ICD-10-CM | POA: Diagnosis not present

## 2021-06-16 DIAGNOSIS — N83201 Unspecified ovarian cyst, right side: Secondary | ICD-10-CM | POA: Diagnosis not present

## 2021-06-23 DIAGNOSIS — Z131 Encounter for screening for diabetes mellitus: Secondary | ICD-10-CM | POA: Diagnosis not present

## 2021-06-23 DIAGNOSIS — Z6822 Body mass index (BMI) 22.0-22.9, adult: Secondary | ICD-10-CM | POA: Diagnosis not present

## 2021-06-23 DIAGNOSIS — Z13220 Encounter for screening for lipoid disorders: Secondary | ICD-10-CM | POA: Diagnosis not present

## 2021-06-23 DIAGNOSIS — Z13820 Encounter for screening for osteoporosis: Secondary | ICD-10-CM | POA: Diagnosis not present

## 2021-06-23 DIAGNOSIS — Z13228 Encounter for screening for other metabolic disorders: Secondary | ICD-10-CM | POA: Diagnosis not present

## 2021-06-23 DIAGNOSIS — Z01419 Encounter for gynecological examination (general) (routine) without abnormal findings: Secondary | ICD-10-CM | POA: Diagnosis not present

## 2021-06-24 DIAGNOSIS — Z1151 Encounter for screening for human papillomavirus (HPV): Secondary | ICD-10-CM | POA: Diagnosis not present

## 2021-06-24 DIAGNOSIS — Z124 Encounter for screening for malignant neoplasm of cervix: Secondary | ICD-10-CM | POA: Diagnosis not present

## 2021-07-10 IMAGING — US US RENAL
1 series · 14 of 25 positions shown · non-contrast
Comparison: CT abdomen and pelvis 10/07/2019

CLINICAL DATA: Nephrolithiasis, history of cystoscopy with ureteral
stenting in September 2019

EXAM:
RENAL / URINARY TRACT ULTRASOUND COMPLETE

[Series 1: us renal · 14 of 52 slices shown]
[im 1/52]
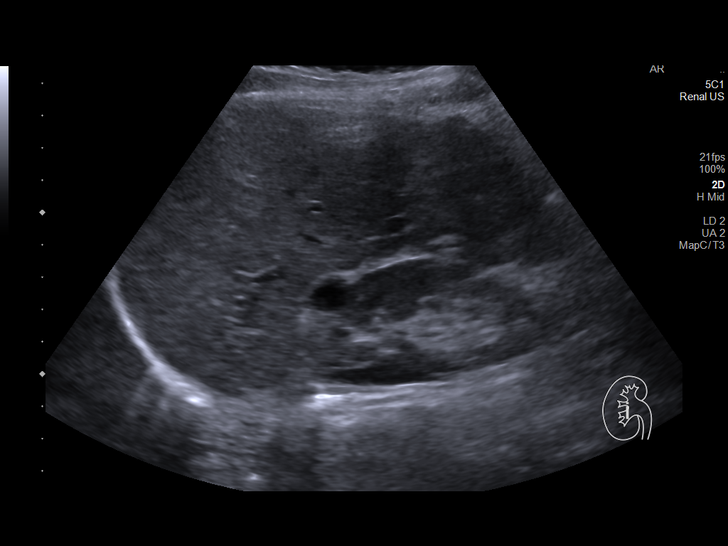
[im 5/52]
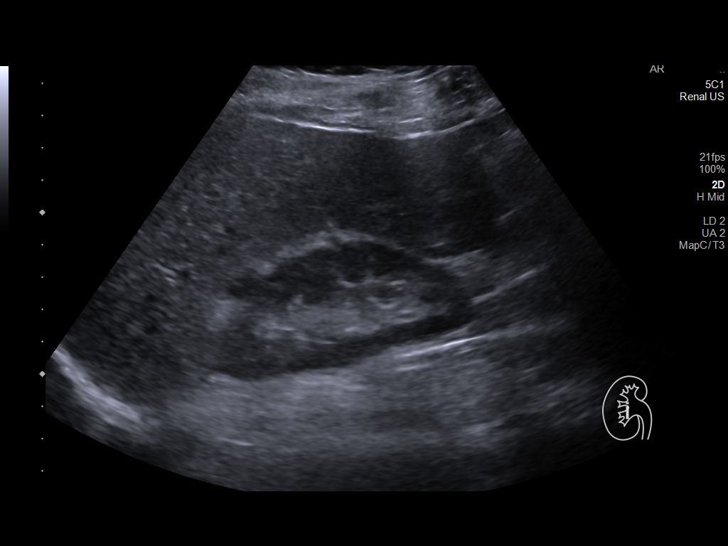
[im 9/52]
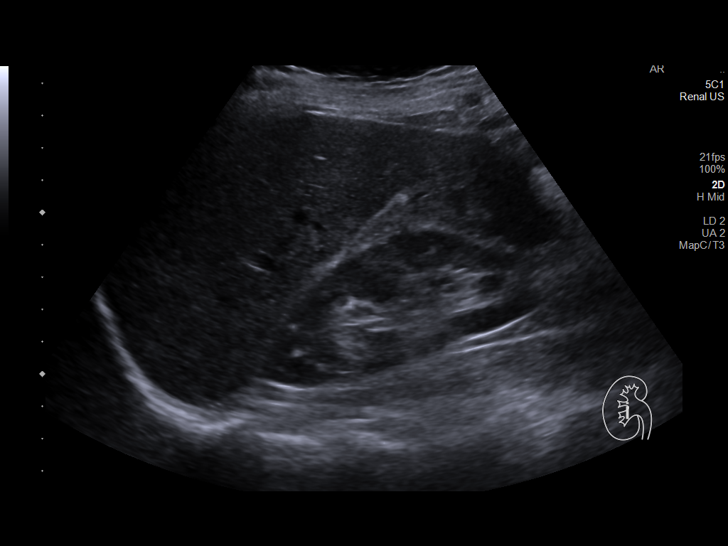
[im 13/52]
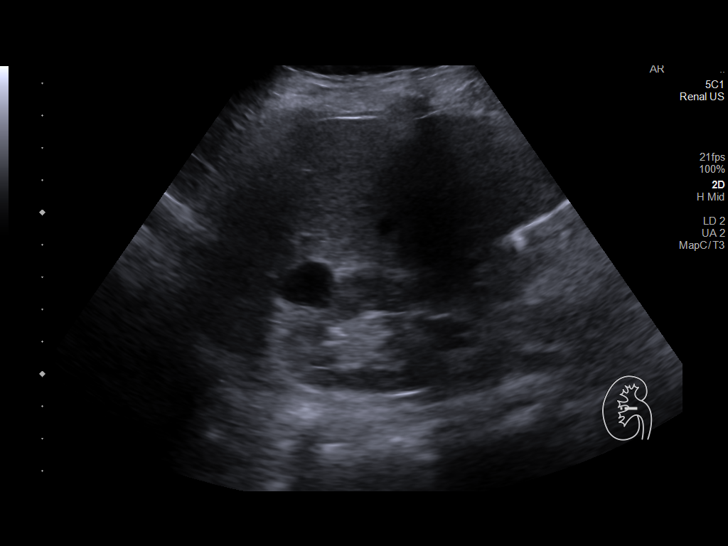
[im 18/52]
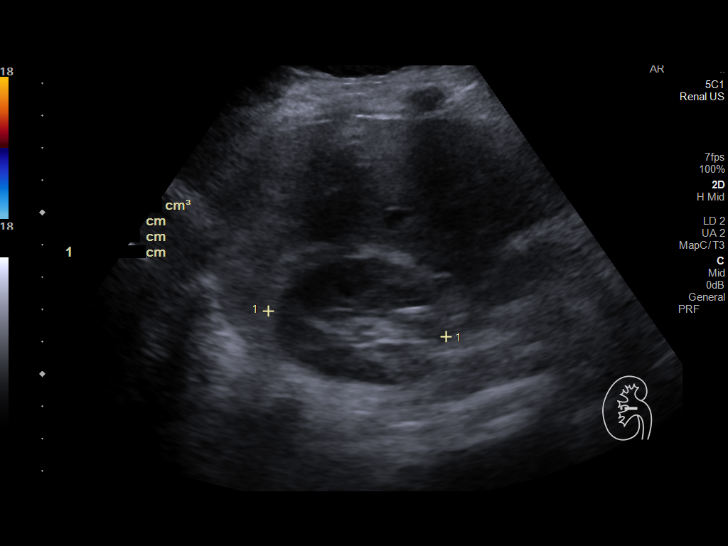
[im 20/52]
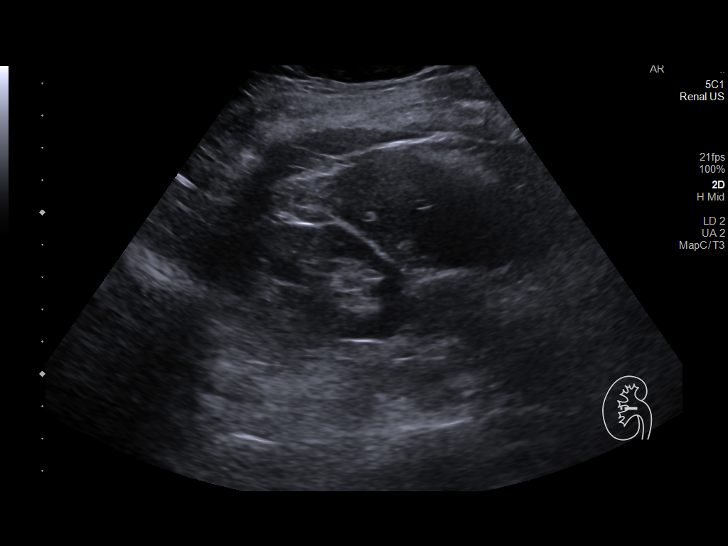
[im 24/52]
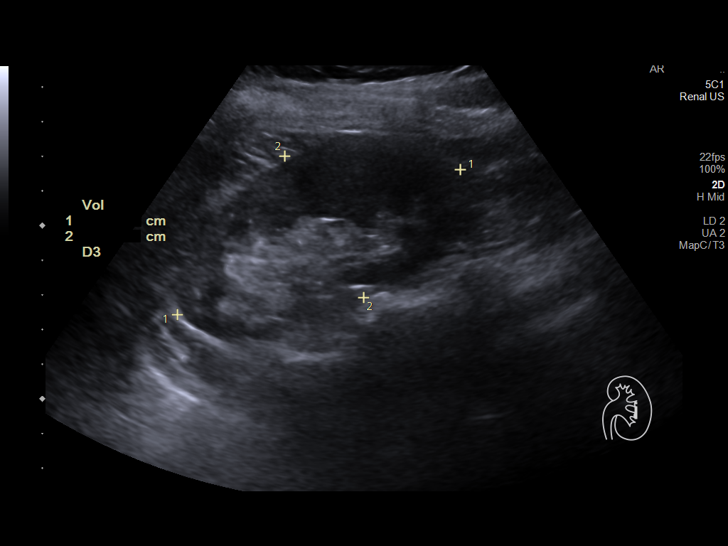
[im 28/52]
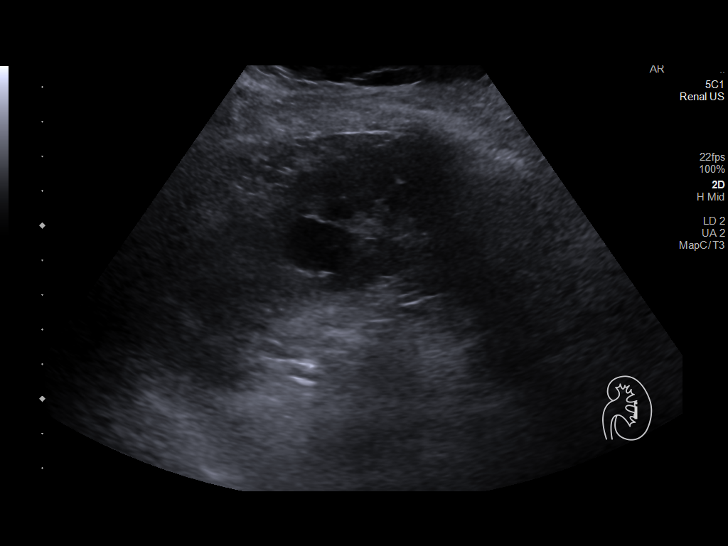
[im 32/52]
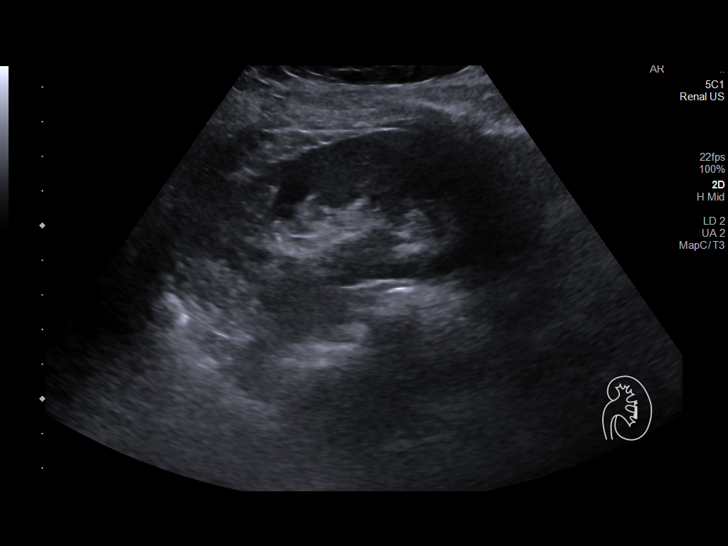
[im 35/52]
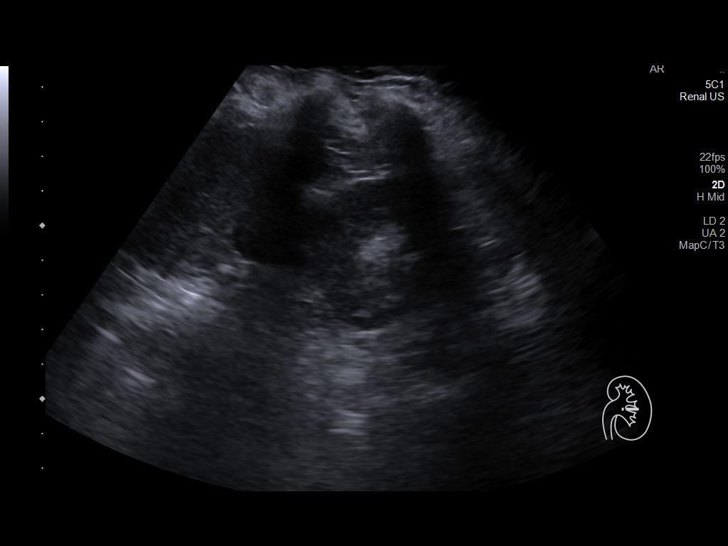
[im 39/52]
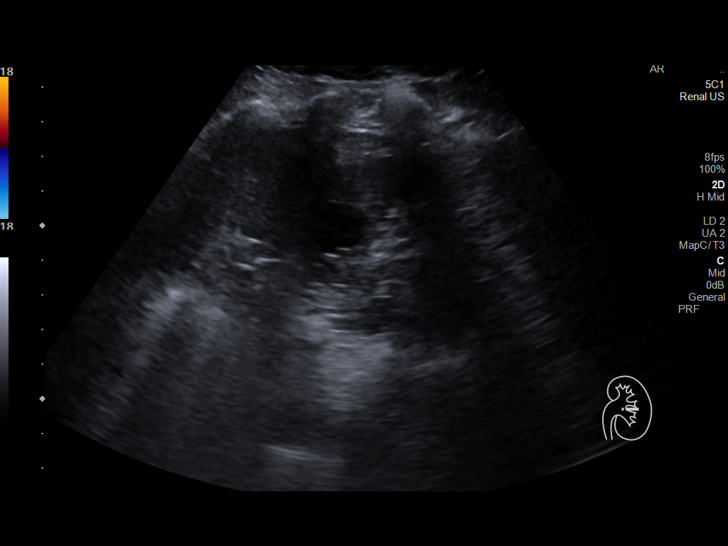
[im 43/52]
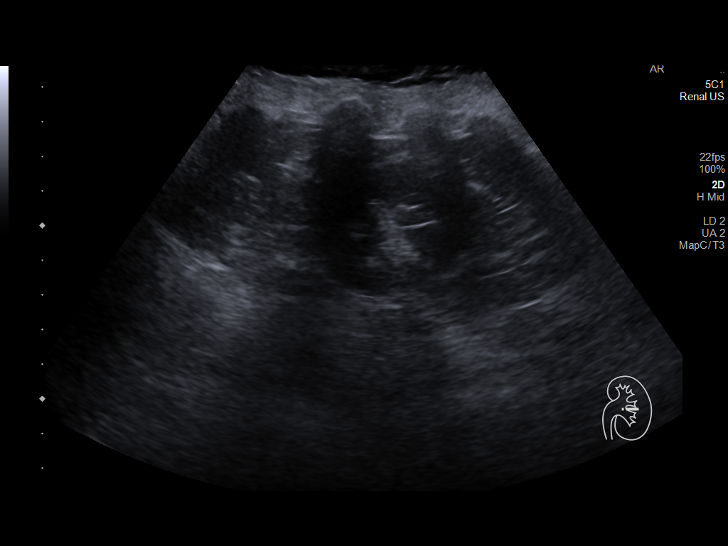
[im 47/52]
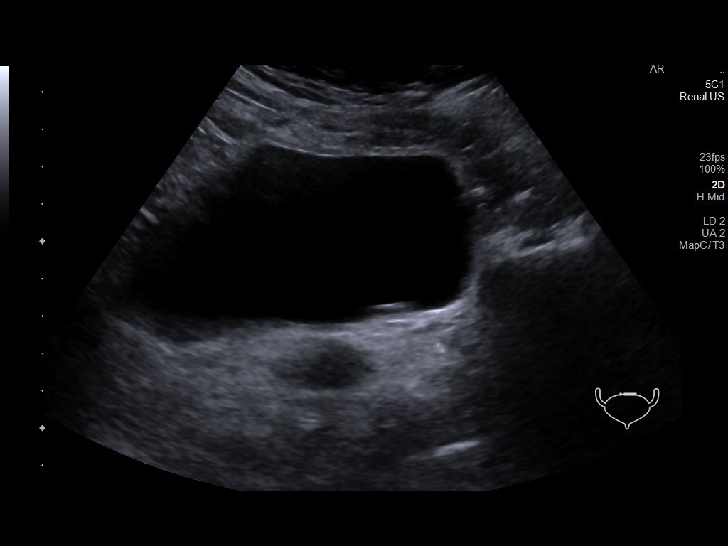
[im 52/52]
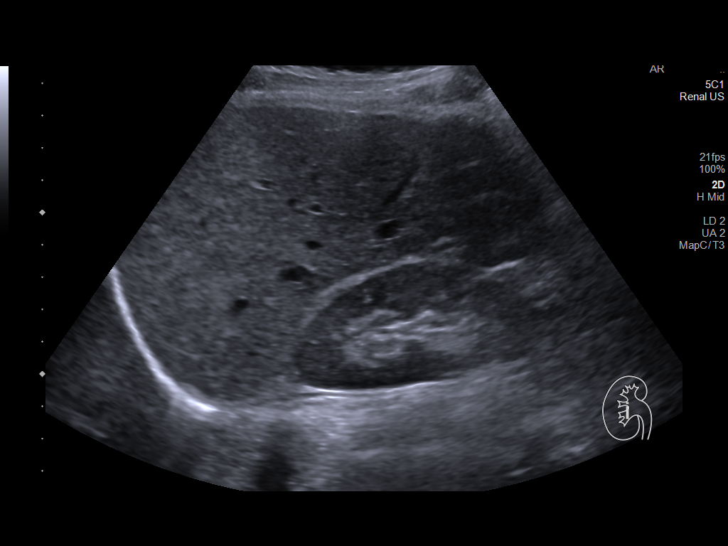

[14 of 25 positions shown; findings below may reference images not displayed]

FINDINGS: Right Kidney:

Renal measurements: 9.8 x 4.1 x 5.6 cm = volume: 117 mL. Small
exophytic simple cyst 1.7 x 2.0 x 1.9 cm. No additional mass or
hydronephrosis.

Left Kidney:

Renal measurements: 9.2 x 4.7 x 4.6 cm = volume: 103 mL. Normal
cortical thickness and echogenicity. Peripelvic cyst at upper pole
2.3 x 1.5 x 1.7 cm, simple in character. No additional mass or
hydronephrosis.

Bladder:

Appears normal for degree of bladder distention.

Other:

No shadowing calculi are seen in either kidney.
IMPRESSION: BILATERAL renal cysts as above.

Remainder of exam unremarkable.

## 2021-08-26 ENCOUNTER — Ambulatory Visit (HOSPITAL_COMMUNITY)
Admission: RE | Admit: 2021-08-26 | Discharge: 2021-08-26 | Disposition: A | Discharge status: Home / Self Care | Payer: 59 | Source: Ambulatory Visit | Attending: Urology | Admitting: Urology

## 2021-08-26 DIAGNOSIS — Z87442 Personal history of urinary calculi: Secondary | ICD-10-CM | POA: Diagnosis not present

## 2021-08-26 DIAGNOSIS — N200 Calculus of kidney: Secondary | ICD-10-CM | POA: Insufficient documentation

## 2021-08-26 DIAGNOSIS — N281 Cyst of kidney, acquired: Secondary | ICD-10-CM | POA: Diagnosis not present

## 2021-08-29 ENCOUNTER — Encounter: Payer: Self-pay | Admitting: Urology

## 2021-08-29 ENCOUNTER — Ambulatory Visit: Payer: 59 | Admitting: Urology

## 2021-08-29 VITALS — BP 128/49 | HR 61

## 2021-08-29 DIAGNOSIS — N200 Calculus of kidney: Secondary | ICD-10-CM

## 2021-08-29 LAB — URINALYSIS, ROUTINE W REFLEX MICROSCOPIC
Bilirubin, UA: NEGATIVE
Glucose, UA: NEGATIVE
Leukocytes,UA: NEGATIVE
Nitrite, UA: NEGATIVE
Protein,UA: NEGATIVE
Specific Gravity, UA: 1.025 (ref 1.005–1.030)
Urobilinogen, Ur: 1 mg/dL (ref 0.2–1.0)
pH, UA: 6 (ref 5.0–7.5)

## 2021-08-29 LAB — MICROSCOPIC EXAMINATION: Renal Epithel, UA: NONE SEEN /hpf

## 2021-08-29 NOTE — Progress Notes (Signed)
08/29/2021 1:07 PM   Heather Arnold 30-Apr-1960 [id number]  Referring provider: Sharilyn Sites, [address] [place],  [place] [address]  Followup nephrolithiasis   HPI: Heather Arnold is a 61yo here for followup for nephrolithiasis. No stone events since last visit. No flank pain. She drinks 40oz water daily. Renal US 5/30 shows bilateral cysts, no calculi.   PMH: Past Medical History:  Diagnosis Date   Arteriosclerotic coronary artery disease    Hiatal hernia    Kidney stones 10/16/2019   Ovarian cyst     Surgical History: Past Surgical History:  Procedure Laterality Date   CHOLECYSTECTOMY     CYSTOSCOPY WITH HOLMIUM LASER LITHOTRIPSY Left 10/16/2019   Procedure: CYSTOSCOPY WITH HOLMIUM LASER LITHOTRIPSY;  Surgeon: Cleon Gustin, MD;  Location: [facility];  Service: Urology;  Laterality: Left;   CYSTOSCOPY WITH RETROGRADE PYELOGRAM, URETEROSCOPY AND STENT PLACEMENT Left 10/16/2019   Procedure: CYSTOSCOPY WITH RETROGRADE PYELOGRAM, URETEROSCOPY AND STENT PLACEMENT;  Surgeon: Cleon Gustin, MD;  Location: [facility];  Service: Urology;  Laterality: Left;  pt knows to arrive at 7:00   [place] Medications:  Allergies as of 08/29/2021   No Known Allergies      Medication List        Accurate as of August 29, 2021  1:07 PM. If you have any questions, ask your nurse or doctor.          alendronate 70 MG tablet Commonly known as: FOSAMAX Take 70 mg by mouth once a week.   Calcium Citrate-Vitamin D 315-250 MG-UNIT Tabs Take 1 tablet by mouth daily.   cetirizine 10 MG tablet Commonly known as: ZYRTEC Take 10 mg by mouth daily.   fluticasone 50 MCG/ACT nasal spray Commonly known as: FLONASE Place 2 sprays into both nostrils daily.   HYDROcodone-acetaminophen 5-325 MG tablet Commonly known as: NORCO/VICODIN hydrocodone 5 mg-acetaminophen 325 mg tablet  TAKE 1 TO  2 TABLETS BY MOUTH EVERY 6 HOURS AS NEEDED   meclizine 12.5 MG tablet Commonly known as: ANTIVERT Take 1 tablet (12.5 mg total) by mouth 3 (three) times daily as needed for dizziness.        Allergies: No Known Allergies  Family History: Family History  Problem Relation Age of Onset   Kidney cancer Father    Heart failure Father     Social History:  reports that she has never smoked. She has never used smokeless tobacco. She reports that she does not drink alcohol and does not use drugs.  ROS: All other review of systems were reviewed and are negative except what is noted above in HPI  Physical Exam: BP (!) 128/49   Pulse 61   Constitutional:  Alert and oriented, No acute distress. HEENT: [place] AT, moist mucus membranes.  Trachea midline, no masses. Cardiovascular: No clubbing, cyanosis, or edema. Respiratory: Normal respiratory effort, no increased work of breathing. GI: Abdomen is soft, nontender, nondistended, no abdominal masses GU: No CVA tenderness.  Lymph: No cervical or inguinal lymphadenopathy. Skin: No rashes, bruises or suspicious lesions. Neurologic: Grossly intact, no focal deficits, moving all 4 extremities. Psychiatric: Normal mood and affect.  Laboratory Data: Lab Results  Component Value Date   WBC 11.3 (H) 10/07/2019   HGB 13.4 10/07/2019   HCT 39.8 10/07/2019   MCV 91.1 10/07/2019   PLT 335 10/07/2019  Lab Results  Component Value Date   CREATININE 1.08 (H) 10/07/2019    No results found for: PSA  No results found for: TESTOSTERONE  No results found for: HGBA1C  Urinalysis    Component Value Date/Time   COLORURINE YELLOW 10/07/2019 0906   APPEARANCEUR Clear 08/28/2020 1443   LABSPEC 1.021 10/07/2019 0906   PHURINE 7.0 10/07/2019 0906   GLUCOSEU Negative 08/28/2020 1443   HGBUR LARGE (A) 10/07/2019 0906   BILIRUBINUR Negative 08/28/2020 1443   KETONESUR NEGATIVE 10/07/2019 0906   PROTEINUR Negative 08/28/2020 1443   PROTEINUR 30  (A) 10/07/2019 0906   UROBILINOGEN 0.2 10/11/2019 0921   NITRITE Negative 08/28/2020 1443   NITRITE NEGATIVE 10/07/2019 0906   LEUKOCYTESUR Negative 08/28/2020 1443   LEUKOCYTESUR NEGATIVE 10/07/2019 0906    Lab Results  Component Value Date   LABMICR Comment 08/28/2020   WBCUA 0-5 12/05/2019   LABEPIT 0-10 12/05/2019   MUCUS Present 10/25/2019   BACTERIA Few 12/05/2019    Pertinent Imaging: Renal US 08/26/2021: Images reviewed and discussed with the patient  Results for orders placed during the hospital encounter of 10/11/19  Abdomen 1 view (KUB)  Narrative CLINICAL DATA:  Nephrolithiasis with pain and hematuria  EXAM: ABDOMEN - 1 VIEW  COMPARISON:  Renal ultrasound October 07, 2019  FINDINGS: There are small probable phleboliths in the pelvis. There is no other abnormal calcification. There are surgical clips scattered throughout the abdomen and pelvis. There is moderate stool in the colon. No appreciable bowel dilatation or air-fluid level to suggest bowel obstruction. No free air.  IMPRESSION: Probable phleboliths in the pelvis. No bowel obstruction or free air.  There are clips in the gallbladder fossa region. There are apparent tubal ligation clips in the left pelvis as well as clips in the right lower abdomen and left upper pelvis. Clip on the left is in the expected location of the fallopian tubes. The clip on the right may well have become dislodged.  These results will be called to the ordering clinician or representative by the Radiologist Assistant, and communication documented in the PACS or [employer].   Electronically Signed By: Lowella Grip III M.D. On: 10/11/2019 15:18  No results found for this or any previous visit.  No results found for this or any previous visit.  No results found for this or any previous visit.  Results for orders placed during the hospital encounter of 08/26/21  Ultrasound renal  complete  Narrative CLINICAL DATA:  Nephrolithiasis  EXAM: RENAL / URINARY TRACT ULTRASOUND COMPLETE  COMPARISON:  Renal ultrasound November 13, 2019  FINDINGS: Right Kidney:  Renal measurements: 8.4 x 4.1 x 4.8 cm = volume: 88 mL. There is a 2.2 cm simple cyst in the right kidney of no clinical significance. No follow-up imaging is recommended.  Left Kidney:  Renal measurements: 9.9 x 4.4 x 4.8 cm = volume: 1 0 a mL. There is a 1.9 cm simple cyst in the left kidney of no clinical significance. No follow-up imaging is recommended.  Bladder:  Appears normal for degree of bladder distention.  Other:  None.  IMPRESSION: No renal stones identified.  No significant abnormalities.   Electronically Signed By: Dorise Bullion III M.D. On: 08/27/2021 13:00  No results found for this or any previous visit.  No results found for this or any previous visit.  Results for orders placed during the hospital encounter of 10/07/19  CT Renal Stone Study  Narrative CLINICAL DATA:  Left-sided flank pain with  nausea and vomiting  EXAM: CT ABDOMEN AND PELVIS WITHOUT CONTRAST  TECHNIQUE: Multidetector CT imaging of the abdomen and pelvis was performed following the standard protocol without oral or IV contrast.  COMPARISON:  None.  FINDINGS: Lower chest: Lung bases are clear.  There is a focal hiatal hernia.  Hepatobiliary: No focal liver lesions are appreciable on this noncontrast enhanced study. The gallbladder is absent. There is no appreciable biliary duct dilatation.  Pancreas: There is no pancreatic mass or inflammatory focus.  Spleen: No splenic lesions are evident.  Adrenals/Urinary Tract: Adrenals bilaterally appear normal. There is a cyst arising from the lateral upper right kidney measuring 1.8 x 1.8 cm. There is a cyst in the anterior upper left kidney measuring 1.7 x 1.7 cm. There is mild hydronephrosis on the left. There is no hydronephrosis on the right.  There is no intrarenal calculus on either side. There is a 4 x 3 mm calculus in the proximal left ureter at the L3-4 level. No other ureteral calculi are appreciable. The urinary bladder is midline with wall thickness within normal limits.  Stomach/Bowel: There is no appreciable bowel wall or mesenteric thickening. The terminal ileum appears normal. There is no evident bowel obstruction. There is no free air or portal venous air.  Vascular/Lymphatic: There is no abdominal aortic aneurysm. There are scattered foci of aortic atherosclerosis. There is no evident adenopathy in the abdomen or pelvis.  Reproductive: The uterus is anteverted. There is a cyst arising in the right adnexa measuring 3.8 x 3.4 cm. No other pelvic mass evident.  Other: Appendix appears normal. No abscess or ascites is evident in the abdomen or pelvis.  Musculoskeletal: No blastic or lytic bone lesions. No intramuscular or abdominal wall lesions are evident.  IMPRESSION: 1. 4 x 3 mm calculus in the proximal left ureter at the L3-4 level with mild hydronephrosis on the left.  2. Right ovarian/adnexal cyst measuring 3.8 x 3.4 cm. Simple cyst most likely etiology. No other pelvic mass.  3. No bowel obstruction. No abscess in the abdomen or pelvis. Appendix appears normal.  4.  Gallbladder absent.  5.  Focal hiatal hernia present.  6.  Aortic Atherosclerosis (ICD10-I70.0).   Electronically Signed By: Lowella Grip III M.D. On: 10/07/2019 09:28   Assessment & Plan:    1. Kidney stones -Dietary handout given - Urinalysis, Routine w reflex microscopic   No follow-ups on file.  [name], MD  [facility] [place]

## 2021-08-29 NOTE — Patient Instructions (Signed)
Dietary Guidelines to Help Prevent Kidney Stones Kidney stones are deposits of minerals and salts that form inside your kidneys. Your risk of developing kidney stones may be greater depending on your diet, your lifestyle, the medicines you take, and whether you have certain medical conditions. Most people can lower their chances of developing kidney stones by following the instructions below. Your dietitian may give you more specific instructions depending on your overall health and the type of kidney stones you tend to develop. What are tips for following this plan? Reading food labels  Choose foods with "no salt added" or "low-salt" labels. Limit your salt (sodium) intake to less than 1,500 mg a day. Choose foods with calcium for each meal and snack. Try to eat about 300 mg of calcium at each meal. Foods that contain 200-500 mg of calcium a serving include: 8 oz (237 mL) of milk, calcium-fortifiednon-dairy milk, and calcium-fortifiedfruit juice. Calcium-fortified means that calcium has been added to these drinks. 8 oz (237 mL) of kefir, yogurt, and soy yogurt. 4 oz (114 g) of tofu. 1 oz (28 g) of cheese. 1 cup (150 g) of dried figs. 1 cup (91 g) of cooked broccoli. One 3 oz (85 g) can of sardines or mackerel. Most people need 1,000-1,500 mg of calcium a day. Talk to your dietitian about how much calcium is recommended for you. Shopping Buy plenty of fresh fruits and vegetables. Most people do not need to avoid fruits and vegetables, even if these foods contain nutrients that may contribute to kidney stones. When shopping for convenience foods, choose: Whole pieces of fruit. Pre-made salads with dressing on the side. Low-fat fruit and yogurt smoothies. Avoid buying frozen meals or prepared deli foods. These can be high in sodium. Look for foods with live cultures, such as yogurt and kefir. Choose high-fiber grains, such as whole-wheat breads, oat bran, and wheat cereals. Cooking Do not add  salt to food when cooking. Place a salt shaker on the table and allow each person to add his or her own salt to taste. Use vegetable protein, such as beans, textured vegetable protein (TVP), or tofu, instead of meat in pasta, casseroles, and soups. Meal planning Eat less salt, if told by your dietitian. To do this: Avoid eating processed or pre-made food. Avoid eating fast food. Eat less animal protein, including cheese, meat, poultry, or fish, if told by your dietitian. To do this: Limit the number of times you have meat, poultry, fish, or cheese each week. Eat a diet free of meat at least 2 days a week. Eat only one serving each day of meat, poultry, fish, or seafood. When you prepare animal protein, cut pieces into small portion sizes. For most meat and fish, one serving is about the size of the palm of your hand. Eat at least five servings of fresh fruits and vegetables each day. To do this: Keep fruits and vegetables on hand for snacks. Eat one piece of fruit or a handful of berries with breakfast. Have a salad and fruit at lunch. Have two kinds of vegetables at dinner. Limit foods that are high in a substance called oxalate. These include: Spinach (cooked), rhubarb, beets, sweet potatoes, and Swiss chard. Peanuts. Potato chips, french fries, and baked potatoes with skin on. Nuts and nut products. Chocolate. If you regularly take a diuretic medicine, make sure to eat at least 1 or 2 servings of fruits or vegetables that are high in potassium each day. These include: Avocado. Banana. Orange, prune,   carrot, or tomato juice. Baked potato. Cabbage. Beans and split peas. Lifestyle  Drink enough fluid to keep your urine pale yellow. This is the most important thing you can do. Spread your fluid intake throughout the day. If you drink alcohol: Limit how much you use to: 0-1 drink a day for women who are not pregnant. 0-2 drinks a day for men. Be aware of how much alcohol is in your  drink. In the [place] one drink equals one 12 oz bottle of beer (355 mL), one 5 oz glass of wine (148 mL), or one 1 oz glass of hard liquor (44 mL). Lose weight if told by your health care provider. Work with your dietitian to find an eating plan and weight loss strategies that work best for you. General information Talk to your health care provider and dietitian about taking daily supplements. You may be told the following depending on your health and the cause of your kidney stones: Not to take supplements with vitamin C. To take a calcium supplement. To take a daily probiotic supplement. To take other supplements such as magnesium, fish oil, or vitamin B6. Take over-the-counter and prescription medicines only as told by your health care provider. These include supplements. What foods should I limit? Limit your intake of the following foods, or eat them as told by your dietitian. Vegetables Spinach. Rhubarb. Beets. Canned vegetables. Pickles. Olives. Baked potatoes with skin. Grains Wheat bran. Baked goods. Salted crackers. Cereals high in sugar. Meats and other proteins Nuts. Nut butters. Large portions of meat, poultry, or fish. Salted, precooked, or cured meats, such as sausages, meat loaves, and hot dogs. Dairy Cheese. Beverages Regular soft drinks. Regular vegetable juice. Seasonings and condiments Seasoning blends with salt. Salad dressings. Soy sauce. Ketchup. Barbecue sauce. Other foods Canned soups. Canned pasta sauce. Casseroles. Pizza. Lasagna. Frozen meals. Potato chips. French fries. The items listed above may not be a complete list of foods and beverages you should limit. Contact a dietitian for more information. What foods should I avoid? Talk to your dietitian about specific foods you should avoid based on the type of kidney stones you have and your overall health. Fruits Grapefruit. The item listed above may not be a complete list of foods and beverages you should  avoid. Contact a dietitian for more information. Summary Kidney stones are deposits of minerals and salts that form inside your kidneys. You can lower your risk of kidney stones by making changes to your diet. The most important thing you can do is drink enough fluid. Drink enough fluid to keep your urine pale yellow. Talk to your dietitian about how much calcium you should have each day, and eat less salt and animal protein as told by your dietitian. This information is not intended to replace advice given to you by your health care provider. Make sure you discuss any questions you have with your health care provider. Document Revised: 11/25/2020 Document Reviewed: 11/25/2020 Elsevier Patient Education  2023 [employer]  

## 2021-11-18 DIAGNOSIS — L738 Other specified follicular disorders: Secondary | ICD-10-CM | POA: Diagnosis not present

## 2021-11-18 DIAGNOSIS — L570 Actinic keratosis: Secondary | ICD-10-CM | POA: Diagnosis not present

## 2021-11-18 DIAGNOSIS — X32XXXD Exposure to sunlight, subsequent encounter: Secondary | ICD-10-CM | POA: Diagnosis not present

## 2021-11-18 DIAGNOSIS — D2262 Melanocytic nevi of left upper limb, including shoulder: Secondary | ICD-10-CM | POA: Diagnosis not present

## 2022-02-13 ENCOUNTER — Other Ambulatory Visit (HOSPITAL_COMMUNITY): Payer: Self-pay | Admitting: Obstetrics and Gynecology

## 2022-02-13 DIAGNOSIS — Z1231 Encounter for screening mammogram for malignant neoplasm of breast: Secondary | ICD-10-CM

## 2022-02-25 ENCOUNTER — Ambulatory Visit (HOSPITAL_COMMUNITY)
Admission: RE | Admit: 2022-02-25 | Discharge: 2022-02-25 | Disposition: A | Discharge status: Home / Self Care | Payer: 59 | Source: Ambulatory Visit | Attending: Obstetrics and Gynecology | Admitting: Obstetrics and Gynecology

## 2022-02-25 DIAGNOSIS — Z1231 Encounter for screening mammogram for malignant neoplasm of breast: Secondary | ICD-10-CM | POA: Insufficient documentation

## 2022-07-13 DIAGNOSIS — Z6824 Body mass index (BMI) 24.0-24.9, adult: Secondary | ICD-10-CM | POA: Diagnosis not present

## 2022-07-13 DIAGNOSIS — Z01419 Encounter for gynecological examination (general) (routine) without abnormal findings: Secondary | ICD-10-CM | POA: Diagnosis not present

## 2022-09-15 ENCOUNTER — Ambulatory Visit
Admission: EM | Admit: 2022-09-15 | Discharge: 2022-09-15 | Disposition: A | Discharge status: Home / Self Care | Payer: Commercial Managed Care - PPO

## 2022-09-15 DIAGNOSIS — J069 Acute upper respiratory infection, unspecified: Secondary | ICD-10-CM | POA: Diagnosis not present

## 2022-09-15 MED ORDER — BENZONATATE 100 MG PO CAPS: 100.0000 mg | ORAL_CAPSULE | Freq: Three times a day (TID) | ORAL | 0 refills | Status: DC | PRN

## 2022-09-15 MED ORDER — PROMETHAZINE-DM 6.25-15 MG/5ML PO SYRP: 5.0000 mL | ORAL_SOLUTION | Freq: Every evening | ORAL | 0 refills | Status: DC | PRN

## 2022-09-15 NOTE — ED Triage Notes (Signed)
Pt states dry cough for over week. Has been taking OTC cough medicine.

## 2022-09-15 NOTE — Discharge Instructions (Signed)
You have a viral upper respiratory infection.  Symptoms should improve over the next week to 10 days.  If you develop chest pain or shortness of breath, go to the emergency room.  Some things that can make you feel better are: - Increased rest - Increasing fluid with water/sugar free electrolytes - Acetaminophen and ibuprofen as needed for fever/pain - Salt water gargling, chloraseptic spray and throat lozenges for sore throat - OTC guaifenesin (Mucinex) 600 mg twice daily for congestion - Saline sinus flushes or a neti pot - Humidifying the air -Tessalon Perles during the day as needed for dry cough and cough syrup at nighttime as needed for dry cough 

## 2022-09-15 NOTE — ED Provider Notes (Signed)
[facility]    CSN: [id number] Arrival date & time: 09/15/22  1029      History   Chief Complaint Chief Complaint  Patient presents with   Cough    HPI Heather Arnold is a 62 y.o. female.   Patient presents today with 6-day history of chills, occasional congested cough, mostly dry cough, chest pain after coughing, runny and stuffy nose, postnasal drainage and sore throat, sinus pressure, headache after coughing, decreased appetite, and fatigue.  She denies known fevers, body aches, wheezing, chest tightness, ear pain, abdominal pain, nausea/vomiting, and diarrhea.  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  No sick family members.  She has taken over-the-counter they both cough and DayQuil/NyQuil without improvement in symptoms.    Past Medical History:  Diagnosis Date   Arteriosclerotic coronary artery disease    Hiatal hernia    Kidney stones 10/16/2019   Ovarian cyst     Patient Active Problem List   Diagnosis Date Noted   Kidney stones 10/16/2019   Hypokalemia 01/16/2011   Bradycardia 01/16/2011   Chest pain 01/15/2011   Vomiting 01/15/2011   Heartburn 01/15/2011    Past Surgical History:  Procedure Laterality Date   CHOLECYSTECTOMY     CYSTOSCOPY WITH HOLMIUM LASER LITHOTRIPSY Left 10/16/2019   Procedure: CYSTOSCOPY WITH HOLMIUM LASER LITHOTRIPSY;  Surgeon: Malen Gauze, MD;  Location: [facility];  Service: Urology;  Laterality: Left;   CYSTOSCOPY WITH RETROGRADE PYELOGRAM, URETEROSCOPY AND STENT PLACEMENT Left 10/16/2019   Procedure: CYSTOSCOPY WITH RETROGRADE PYELOGRAM, URETEROSCOPY AND STENT PLACEMENT;  Surgeon: Malen Gauze, MD;  Location: [facility];  Service: Urology;  Laterality: Left;  pt knows to arrive at 7:00   CYSTOSCOPY WITH STENT PLACEMENT  1989   THYROID SURGERY     TUBAL LIGATION     TUBAL LIGATION      OB History   No obstetric history on file.      Home Medications    Prior  to Admission medications   Medication Sig Start Date End Date Taking? Authorizing Provider  benzonatate (TESSALON) 100 MG capsule Take 1 capsule (100 mg total) by mouth 3 (three) times daily as needed for cough. Do not take with alcohol or while driving or operating heavy machinery.  May cause drowsiness. 09/15/22  Yes Valentino Nose, NP  Glucosamine-Chondroitin 500-400 MG CAPS  08/29/18  Yes [provider]  promethazine-dextromethorphan (PROMETHAZINE-DM) 6.25-15 MG/5ML syrup Take 5 mLs by mouth at bedtime as needed for cough. Do not take with alcohol or while driving or operating heavy machinery.  May cause drowsiness. 09/15/22  Yes Valentino Nose, NP  alendronate (FOSAMAX) 70 MG tablet Take 70 mg by mouth once a week. 05/27/19   [provider]  Calcium Citrate-Vitamin D 315-250 MG-UNIT TABS Take 1 tablet by mouth daily.    [provider]  cetirizine (ZYRTEC) 10 MG tablet Take 10 mg by mouth daily.    [provider]  fluticasone (FLONASE) 50 MCG/ACT nasal spray Place 2 sprays into both nostrils daily. 11/18/19   Dahlia Byes A, NP  HYDROcodone-acetaminophen (NORCO/VICODIN) 5-325 MG tablet hydrocodone 5 mg-acetaminophen 325 mg tablet  TAKE 1 TO 2 TABLETS BY MOUTH EVERY 6 HOURS AS NEEDED    [provider]  meclizine (ANTIVERT) 12.5 MG tablet Take 1 tablet (12.5 mg total) by mouth 3 (three) times daily as needed for dizziness. Patient not taking: Reported on 08/29/2021 11/18/19  Janace Aris, NP    Family History Family History  Problem Relation Age of Onset   Kidney cancer Father    Heart failure Father     Social History Social History   Tobacco Use   Smoking status: Never   Smokeless tobacco: Never  Substance Use Topics   Alcohol use: No   Drug use: No     Allergies   Patient has no known allergies.   Review of Systems Review of Systems Per HPI  Physical Exam Triage Vital Signs ED Triage Vitals  Enc Vitals Group     BP  09/15/22 1037 (!) 145/75     Pulse Rate 09/15/22 1037 82     Resp 09/15/22 1037 16     Temp 09/15/22 1037 99.1 F (37.3 C)     Temp Source 09/15/22 1037 Oral     SpO2 09/15/22 1037 95 %     Weight --      Height --      Head Circumference --      Peak Flow --      Pain Score 09/15/22 1039 3     Pain Loc --      Pain Edu? --      Excl. in GC? --    No data found.  Updated Vital Signs BP (!) 145/75 (BP Location: Left Arm)   Pulse 82   Temp 99.1 F (37.3 C) (Oral)   Resp 16   SpO2 95%   Visual Acuity Right Eye Distance:   Left Eye Distance:   Bilateral Distance:    Right Eye Near:   Left Eye Near:    Bilateral Near:     Physical Exam Vitals and nursing note reviewed.  Constitutional:      General: She is not in acute distress.    Appearance: Normal appearance. She is not ill-appearing or toxic-appearing.  HENT:     Head: Normocephalic and atraumatic.     Right Ear: Tympanic membrane, ear canal and external ear normal.     Left Ear: Tympanic membrane, ear canal and external ear normal.     Nose: No congestion or rhinorrhea.     Mouth/Throat:     Mouth: Mucous membranes are moist.     Pharynx: Oropharynx is clear. No oropharyngeal exudate or posterior oropharyngeal erythema.     Comments: Postnasal drainage Eyes:     General: No scleral icterus.    Extraocular Movements: Extraocular movements intact.  Cardiovascular:     Rate and Rhythm: Normal rate and regular rhythm.  Pulmonary:     Effort: Pulmonary effort is normal. No respiratory distress.     Breath sounds: Normal breath sounds. No wheezing, rhonchi or rales.  Musculoskeletal:     Cervical back: Normal range of motion and neck supple.  Skin:    General: Skin is warm and dry.     Coloration: Skin is not jaundiced or pale.     Findings: No erythema or rash.  Neurological:     Mental Status: She is alert and oriented to person, place, and time.  Psychiatric:        Behavior: Behavior is cooperative.       UC Treatments / Results  Labs (all labs ordered are listed, but only abnormal results are displayed) Labs Reviewed - No data to display  EKG   Radiology No results found.  Procedures Procedures (including critical care time)  Medications Ordered in UC Medications - No data to display  Initial Impression / Assessment and Plan / UC Course  I have reviewed the triage vital signs and the nursing notes.  Pertinent labs & imaging results that were available during my care of the patient were reviewed by me and considered in my medical decision making (see chart for details).   Patient is well-appearing, normotensive, afebrile, not tachycardic, not tachypneic, oxygenating well on room air.    1. Viral URI with cough Suspect viral etiology Vital signs are reassuring today Patient is out of window for viral testing, therefore testing deferred Supportive care discussed Start cough suppressant medication ER and return precautions discussed  The patient was given the opportunity to ask questions.  All questions answered to their satisfaction.  The patient is in agreement to this plan.    Final Clinical Impressions(s) / UC Diagnoses   Final diagnoses:  Viral URI with cough     Discharge Instructions      You have a viral upper respiratory infection.  Symptoms should improve over the next week to 10 days.  If you develop chest pain or shortness of breath, go to the emergency room.  Some things that can make you feel better are: - Increased rest - Increasing fluid with water/sugar free electrolytes - Acetaminophen and ibuprofen as needed for fever/pain - Salt water gargling, chloraseptic spray and throat lozenges for sore throat - OTC guaifenesin (Mucinex) 600 mg twice daily for congestion - Saline sinus flushes or a neti pot - Humidifying the air -Tessalon Perles during the day as needed for dry cough and cough syrup at nighttime as needed for dry cough     ED  Prescriptions     Medication Sig Dispense Auth. Provider   benzonatate (TESSALON) 100 MG capsule Take 1 capsule (100 mg total) by mouth 3 (three) times daily as needed for cough. Do not take with alcohol or while driving or operating heavy machinery.  May cause drowsiness. 21 capsule Cathlean Marseilles A, NP   promethazine-dextromethorphan (PROMETHAZINE-DM) 6.25-15 MG/5ML syrup Take 5 mLs by mouth at bedtime as needed for cough. Do not take with alcohol or while driving or operating heavy machinery.  May cause drowsiness. 118 mL Valentino Nose, NP      PDMP not reviewed this encounter.   Valentino Nose, NP 09/15/22 1059

## 2022-11-13 DIAGNOSIS — M1612 Unilateral primary osteoarthritis, left hip: Secondary | ICD-10-CM | POA: Diagnosis not present

## 2022-11-18 DIAGNOSIS — M25552 Pain in left hip: Secondary | ICD-10-CM | POA: Diagnosis not present

## 2023-02-11 ENCOUNTER — Other Ambulatory Visit (HOSPITAL_COMMUNITY): Payer: Self-pay | Admitting: Obstetrics and Gynecology

## 2023-02-11 DIAGNOSIS — Z1231 Encounter for screening mammogram for malignant neoplasm of breast: Secondary | ICD-10-CM

## 2023-02-11 NOTE — Progress Notes (Signed)
New Patient Office Visit   Subjective   Patient ID: Heather Arnold, female    DOB: 10-Nov-1960  Age: 62 y.o. MRN: [id number]  CC:  Chief Complaint  Patient presents with   Establish Care    Nodule on neck on left side of esophagus. Rt. Lobe of thyroid has been removed.     HPI Heather Arnold 62 year old female, presents to establish care. She  has a past medical history of Arteriosclerotic coronary artery disease, Hiatal hernia, Kidney stones (10/16/2019), and Ovarian cyst.  The patient is here for elevated blood pressure. She reports engaging in regular exercise, walking 1-2 miles three times per week, and adhering to a low-salt diet. Her blood pressure control at home is unknown. She reports cardiac symptoms of chest pain and fatigue but denies dyspnea, lower extremity edema, palpitations, or syncope. Cardiovascular risk factors include a family history of cardiovascular disease.      Outpatient Encounter Medications as of 02/12/2023  Medication Sig   alendronate (FOSAMAX) 70 MG tablet Take 70 mg by mouth once a week.   Calcium Citrate-Vitamin D 315-250 MG-UNIT TABS Take 1 tablet by mouth daily.   Calcium Citrate-Vitamin D 315-5 MG-MCG TABS Take 315 mg by mouth 1 day or 1 dose.   cetirizine (ZYRTEC) 10 MG tablet Take 10 mg by mouth daily.   Glucosamine-Chondroitin 500-400 MG CAPS    [DISCONTINUED] Cetirizine HCl 10 MG CAPS Take 10 mg by mouth 1 day or 1 dose.   benzonatate (TESSALON) 100 MG capsule Take 1 capsule (100 mg total) by mouth 3 (three) times daily as needed for cough. Do not take with alcohol or while driving or operating heavy machinery.  May cause drowsiness. (Patient not taking: Reported on 02/12/2023)   fluticasone (FLONASE) 50 MCG/ACT nasal spray Place 2 sprays into both nostrils daily. (Patient not taking: Reported on 02/12/2023)   HYDROcodone-acetaminophen (NORCO/VICODIN) 5-325 MG tablet hydrocodone 5 mg-acetaminophen 325 mg tablet  TAKE 1 TO 2 TABLETS BY MOUTH  EVERY 6 HOURS AS NEEDED (Patient not taking: Reported on 02/12/2023)   meclizine (ANTIVERT) 12.5 MG tablet Take 1 tablet (12.5 mg total) by mouth 3 (three) times daily as needed for dizziness. (Patient not taking: Reported on 08/29/2021)   promethazine-dextromethorphan (PROMETHAZINE-DM) 6.25-15 MG/5ML syrup Take 5 mLs by mouth at bedtime as needed for cough. Do not take with alcohol or while driving or operating heavy machinery.  May cause drowsiness. (Patient not taking: Reported on 02/12/2023)   No facility-administered encounter medications on file as of 02/12/2023.    Past Surgical History:  Procedure Laterality Date   CHOLECYSTECTOMY     CYSTOSCOPY WITH HOLMIUM LASER LITHOTRIPSY Left 10/16/2019   Procedure: CYSTOSCOPY WITH HOLMIUM LASER LITHOTRIPSY;  Surgeon: Malen Gauze, MD;  Location: [facility];  Service: Urology;  Laterality: Left;   CYSTOSCOPY WITH RETROGRADE PYELOGRAM, URETEROSCOPY AND STENT PLACEMENT Left 10/16/2019   Procedure: CYSTOSCOPY WITH RETROGRADE PYELOGRAM, URETEROSCOPY AND STENT PLACEMENT;  Surgeon: Malen Gauze, MD;  Location: [facility];  Service: Urology;  Laterality: Left;  pt knows to arrive at 7:00   CYSTOSCOPY WITH STENT PLACEMENT  1989   THYROID SURGERY     TUBAL LIGATION     TUBAL LIGATION      Review of Systems  Constitutional:  Negative for chills and fever.  Eyes:  Negative for blurred vision.  Respiratory:  Negative for shortness of breath.   Cardiovascular:  Positive for chest pain.  Gastrointestinal:  Negative for abdominal pain.  Genitourinary:  Negative for dysuria.  Musculoskeletal:  Negative for myalgias.  Neurological:  Positive for headaches.  Psychiatric/Behavioral:  Negative for depression.       Objective    BP (!) 142/83   Pulse 63   Ht 5\' 3"  (1.6 m)   Wt 131 lb (59.4 kg)   SpO2 98%   BMI 23.21 kg/m   Physical Exam Vitals reviewed.  Constitutional:      General: She is not in acute distress.    Appearance: Normal  appearance. She is not ill-appearing, toxic-appearing or diaphoretic.  HENT:     Head: Normocephalic.     Right Ear: Tympanic membrane normal.     Left Ear: Tympanic membrane normal.     Nose: Nose normal.     Mouth/Throat:     Mouth: Mucous membranes are moist.  Eyes:     General:        Right eye: No discharge.        Left eye: No discharge.     Conjunctiva/sclera: Conjunctivae normal.     Pupils: Pupils are equal, round, and reactive to light.  Cardiovascular:     Rate and Rhythm: Normal rate.     Pulses: Normal pulses.     Heart sounds: Normal heart sounds.  Pulmonary:     Effort: Pulmonary effort is normal. No respiratory distress.     Breath sounds: Normal breath sounds.  Abdominal:     Tenderness: There is no abdominal tenderness. There is no right CVA tenderness, left CVA tenderness or guarding.  Musculoskeletal:        General: Normal range of motion.     Cervical back: Normal range of motion.  Skin:    General: Skin is warm and dry.     Capillary Refill: Capillary refill takes less than 2 seconds.  Neurological:     Mental Status: She is alert.     Coordination: Coordination normal.     Gait: Gait normal.  Psychiatric:        Mood and Affect: Mood normal.       Assessment & Plan:  Encounter for screening for cardiovascular disorders -     Microalbumin / creatinine urine ratio -     Lipid panel -     CMP14+EGFR -     CBC with Differential/Platelet  IFG (impaired fasting glucose) -     Hemoglobin A1c  Screening for colorectal cancer  Vitamin B12 deficiency -     Vitamin B12  TSH (thyroid-stimulating hormone deficiency) -     TSH + free T4  Screening for HIV (human immunodeficiency virus) -     HIV Antibody (routine testing w rflx)  Vitamin D deficiency -     VITAMIN D 25 Hydroxy (Vit-D Deficiency, Fractures)  Need for hepatitis C screening test -     Hepatitis C antibody  Need for shingles vaccine  Thyroid nodule Assessment & Plan: Firm  nodule was palpated on the left side of the thyroid. A thyroid ultrasound is ordered for further evaluation.  Orders: -     US THYROID; Future  Iron deficiency anemia, unspecified iron deficiency anemia type -     Iron, TIBC and Ferritin Panel  Screening for colon cancer -     Cologuard  Chest pain, unspecified type -     EKG 12-Lead  Elevated blood pressure reading Assessment & Plan: Vitals:   02/12/23 0948 02/12/23 0955  BP: (!) 159/79 (!) 142/83  Labs and EKG ordered  Discussed with  patient to monitor their blood pressure regularly and maintain a heart-healthy diet rich in fruits, vegetables, whole grains, and low-fat dairy, while reducing sodium intake to less than 2,300 mg per day. Regular physical activity, such as 30 minutes of moderate exercise most days of the week, will help lower blood pressure and improve overall cardiovascular health. Avoiding smoking, limiting alcohol consumption, and managing stress. Take  prescribed medication, & take it as directed and avoid skipping doses. Seek emergency care if your blood pressure is (over 180/100) or you experience chest pain, shortness of breath, or sudden vision changes.Patient verbalizes understanding regarding plan of care and all questions answered.  Follow up in 1-2 weeks with at home blood pressure to monitor trends via my chart     Return in about 1 year (around 02/12/2024), or if symptoms worsen or fail to improve, for routine labs.   Cruzita Lederer Newman Nip, FNP

## 2023-02-11 NOTE — Patient Instructions (Addendum)
        Great to see you today.  I have refilled the medication(s) we provide.    Please follow up with in 2 weeks with at home blood pressure readings to monitor trends via my chart.   If labs were collected, we will inform you of lab results once received either by echart message or telephone call.   - echart message- for normal results that have been seen by the patient already.   - telephone call: abnormal results or if patient has not viewed results in their echart.   - Please take medications as prescribed. - Follow up with your primary health provider if any health concerns arises. - If symptoms worsen please contact your primary care provider and/or visit the emergency department.

## 2023-02-12 ENCOUNTER — Encounter: Payer: Self-pay | Admitting: Family Medicine

## 2023-02-12 ENCOUNTER — Ambulatory Visit: Payer: Commercial Managed Care - PPO | Admitting: Family Medicine

## 2023-02-12 VITALS — BP 142/83 | HR 63 | Ht 63.0 in | Wt 131.0 lb

## 2023-02-12 DIAGNOSIS — R079 Chest pain, unspecified: Secondary | ICD-10-CM | POA: Diagnosis not present

## 2023-02-12 DIAGNOSIS — Z1211 Encounter for screening for malignant neoplasm of colon: Secondary | ICD-10-CM

## 2023-02-12 DIAGNOSIS — E559 Vitamin D deficiency, unspecified: Secondary | ICD-10-CM

## 2023-02-12 DIAGNOSIS — R7301 Impaired fasting glucose: Secondary | ICD-10-CM | POA: Diagnosis not present

## 2023-02-12 DIAGNOSIS — E041 Nontoxic single thyroid nodule: Secondary | ICD-10-CM

## 2023-02-12 DIAGNOSIS — R030 Elevated blood-pressure reading, without diagnosis of hypertension: Secondary | ICD-10-CM | POA: Insufficient documentation

## 2023-02-12 DIAGNOSIS — E538 Deficiency of other specified B group vitamins: Secondary | ICD-10-CM | POA: Diagnosis not present

## 2023-02-12 DIAGNOSIS — Z136 Encounter for screening for cardiovascular disorders: Secondary | ICD-10-CM

## 2023-02-12 DIAGNOSIS — Z1159 Encounter for screening for other viral diseases: Secondary | ICD-10-CM

## 2023-02-12 DIAGNOSIS — E038 Other specified hypothyroidism: Secondary | ICD-10-CM

## 2023-02-12 DIAGNOSIS — D509 Iron deficiency anemia, unspecified: Secondary | ICD-10-CM | POA: Diagnosis not present

## 2023-02-12 DIAGNOSIS — Z23 Encounter for immunization: Secondary | ICD-10-CM

## 2023-02-12 DIAGNOSIS — Z114 Encounter for screening for human immunodeficiency virus [HIV]: Secondary | ICD-10-CM

## 2023-02-12 LAB — EKG 12-LEAD

## 2023-02-12 NOTE — Assessment & Plan Note (Addendum)
Firm nodule was palpated on the left side of the thyroid. A thyroid ultrasound is ordered for further evaluation.

## 2023-02-12 NOTE — Assessment & Plan Note (Addendum)
Vitals:   02/12/23 0948 02/12/23 0955  BP: (!) 159/79 (!) 142/83  Labs and EKG ordered Discussed with  patient to monitor their blood pressure regularly and maintain a heart-healthy diet rich in fruits, vegetables, whole grains, and low-fat dairy, while reducing sodium intake to less than 2,300 mg per day. Regular physical activity, such as 30 minutes of moderate exercise most days of the week, will help lower blood pressure and improve overall cardiovascular health. Avoiding smoking, limiting alcohol consumption, and managing stress. Take  prescribed medication, & take it as directed and avoid skipping doses. Seek emergency care if your blood pressure is (over 180/100) or you experience chest pain, shortness of breath, or sudden vision changes.Patient verbalizes understanding regarding plan of care and all questions answered.  Follow up in 1-2 weeks with at home blood pressure to monitor trends via my chart

## 2023-02-15 DIAGNOSIS — Z114 Encounter for screening for human immunodeficiency virus [HIV]: Secondary | ICD-10-CM | POA: Diagnosis not present

## 2023-02-15 DIAGNOSIS — E038 Other specified hypothyroidism: Secondary | ICD-10-CM | POA: Diagnosis not present

## 2023-02-15 DIAGNOSIS — Z1159 Encounter for screening for other viral diseases: Secondary | ICD-10-CM | POA: Diagnosis not present

## 2023-02-15 DIAGNOSIS — E559 Vitamin D deficiency, unspecified: Secondary | ICD-10-CM | POA: Diagnosis not present

## 2023-02-15 DIAGNOSIS — D509 Iron deficiency anemia, unspecified: Secondary | ICD-10-CM | POA: Diagnosis not present

## 2023-02-15 DIAGNOSIS — Z136 Encounter for screening for cardiovascular disorders: Secondary | ICD-10-CM | POA: Diagnosis not present

## 2023-02-15 DIAGNOSIS — E538 Deficiency of other specified B group vitamins: Secondary | ICD-10-CM | POA: Diagnosis not present

## 2023-02-15 DIAGNOSIS — R7301 Impaired fasting glucose: Secondary | ICD-10-CM | POA: Diagnosis not present

## 2023-02-17 LAB — MICROALBUMIN / CREATININE URINE RATIO
Creatinine, Urine: 199.1 mg/dL
Microalb/Creat Ratio: 7 mg/g{creat} (ref 0–29)
Microalbumin, Urine: 13.2 ug/mL

## 2023-02-17 LAB — CBC WITH DIFFERENTIAL/PLATELET
Basophils Absolute: 0.1 10*3/uL (ref 0.0–0.2)
Basos: 1 %
EOS (ABSOLUTE): 0.1 10*3/uL (ref 0.0–0.4)
Eos: 3 %
Hematocrit: 40.9 % (ref 34.0–46.6)
Hemoglobin: 13.4 g/dL (ref 11.1–15.9)
Immature Grans (Abs): 0 10*3/uL (ref 0.0–0.1)
Immature Granulocytes: 0 %
Lymphocytes Absolute: 2 10*3/uL (ref 0.7–3.1)
Lymphs: 35 %
MCH: 30.4 pg (ref 26.6–33.0)
MCHC: 32.8 g/dL (ref 31.5–35.7)
MCV: 93 fL (ref 79–97)
Monocytes Absolute: 0.4 10*3/uL (ref 0.1–0.9)
Monocytes: 7 %
Neutrophils Absolute: 3 10*3/uL (ref 1.4–7.0)
Neutrophils: 54 %
Platelets: 331 10*3/uL (ref 150–450)
RBC: 4.41 x10E6/uL (ref 3.77–5.28)
RDW: 12.8 % (ref 11.7–15.4)
WBC: 5.6 10*3/uL (ref 3.4–10.8)

## 2023-02-17 LAB — IRON,TIBC AND FERRITIN PANEL
Ferritin: 47 ng/mL (ref 15–150)
Iron Saturation: 25 % (ref 15–55)
Iron: 70 ug/dL (ref 27–139)
Total Iron Binding Capacity: 281 ug/dL (ref 250–450)
UIBC: 211 ug/dL (ref 118–369)

## 2023-02-17 LAB — CMP14+EGFR
ALT: 11 [IU]/L (ref 0–32)
AST: 16 [IU]/L (ref 0–40)
Albumin: 4.1 g/dL (ref 3.9–4.9)
Alkaline Phosphatase: 105 [IU]/L (ref 44–121)
BUN/Creatinine Ratio: 20 (ref 12–28)
BUN: 14 mg/dL (ref 8–27)
Bilirubin Total: 0.8 mg/dL (ref 0.0–1.2)
CO2: 24 mmol/L (ref 20–29)
Calcium: 9.1 mg/dL (ref 8.7–10.3)
Chloride: 102 mmol/L (ref 96–106)
Creatinine, Ser: 0.71 mg/dL (ref 0.57–1.00)
Globulin, Total: 2.3 g/dL (ref 1.5–4.5)
Glucose: 103 mg/dL — ABNORMAL HIGH (ref 70–99)
Potassium: 4.1 mmol/L (ref 3.5–5.2)
Sodium: 140 mmol/L (ref 134–144)
Total Protein: 6.4 g/dL (ref 6.0–8.5)
eGFR: 95 mL/min/{1.73_m2} (ref >59)

## 2023-02-17 LAB — LIPID PANEL
Chol/HDL Ratio: 3.5 ratio (ref 0.0–4.4)
Cholesterol, Total: 197 mg/dL (ref 100–199)
HDL: 56 mg/dL (ref >39)
LDL Chol Calc (NIH): 128 mg/dL — ABNORMAL HIGH (ref 0–99)
Triglycerides: 73 mg/dL (ref 0–149)
VLDL Cholesterol Cal: 13 mg/dL (ref 5–40)

## 2023-02-17 LAB — HIV ANTIBODY (ROUTINE TESTING W REFLEX): HIV Screen 4th Generation wRfx: NONREACTIVE

## 2023-02-17 LAB — TSH+FREE T4
Free T4: 1.05 ng/dL (ref 0.82–1.77)
TSH: 1.26 u[IU]/mL (ref 0.450–4.500)

## 2023-02-17 LAB — HEMOGLOBIN A1C
Est. average glucose Bld gHb Est-mCnc: 120 mg/dL
Hgb A1c MFr Bld: 5.8 % — ABNORMAL HIGH (ref 4.8–5.6)

## 2023-02-17 LAB — VITAMIN B12: Vitamin B-12: 397 pg/mL (ref 232–1245)

## 2023-02-17 LAB — HEPATITIS C ANTIBODY: Hep C Virus Ab: NONREACTIVE

## 2023-02-17 LAB — VITAMIN D 25 HYDROXY (VIT D DEFICIENCY, FRACTURES): Vit D, 25-Hydroxy: 29.4 ng/mL — ABNORMAL LOW (ref 30.0–100.0)

## 2023-02-18 ENCOUNTER — Ambulatory Visit (HOSPITAL_COMMUNITY)
Admission: RE | Admit: 2023-02-18 | Discharge: 2023-02-18 | Disposition: A | Discharge status: Home / Self Care | Payer: Commercial Managed Care - PPO | Source: Ambulatory Visit | Attending: Family Medicine | Admitting: Family Medicine

## 2023-02-18 DIAGNOSIS — E042 Nontoxic multinodular goiter: Secondary | ICD-10-CM | POA: Diagnosis not present

## 2023-02-18 DIAGNOSIS — E041 Nontoxic single thyroid nodule: Secondary | ICD-10-CM | POA: Insufficient documentation

## 2023-02-24 DIAGNOSIS — Z1211 Encounter for screening for malignant neoplasm of colon: Secondary | ICD-10-CM | POA: Diagnosis not present

## 2023-03-03 ENCOUNTER — Ambulatory Visit (HOSPITAL_COMMUNITY)
Admission: RE | Admit: 2023-03-03 | Discharge: 2023-03-03 | Disposition: A | Discharge status: Home / Self Care | Payer: Commercial Managed Care - PPO | Source: Ambulatory Visit | Attending: Obstetrics and Gynecology | Admitting: Obstetrics and Gynecology

## 2023-03-03 DIAGNOSIS — Z1231 Encounter for screening mammogram for malignant neoplasm of breast: Secondary | ICD-10-CM | POA: Insufficient documentation

## 2023-03-04 ENCOUNTER — Encounter (INDEPENDENT_AMBULATORY_CARE_PROVIDER_SITE_OTHER): Payer: Self-pay | Admitting: *Deleted

## 2023-03-04 ENCOUNTER — Telehealth: Payer: Self-pay | Admitting: Family Medicine

## 2023-03-04 ENCOUNTER — Other Ambulatory Visit: Payer: Self-pay | Admitting: Family Medicine

## 2023-03-04 DIAGNOSIS — Z1211 Encounter for screening for malignant neoplasm of colon: Secondary | ICD-10-CM

## 2023-03-04 LAB — COLOGUARD: COLOGUARD: POSITIVE — AB

## 2023-03-04 MED ORDER — AMLODIPINE BESYLATE 2.5 MG PO TABS
2.5000 mg | ORAL_TABLET | Freq: Every day | ORAL | 3 refills | Status: DC
  Filled 2023-04-06: qty 90, 90d supply, fill #0

## 2023-03-04 NOTE — Telephone Encounter (Signed)
Blood pressure medication

## 2023-03-08 ENCOUNTER — Encounter (INDEPENDENT_AMBULATORY_CARE_PROVIDER_SITE_OTHER): Payer: Self-pay | Admitting: Gastroenterology

## 2023-03-08 ENCOUNTER — Ambulatory Visit (INDEPENDENT_AMBULATORY_CARE_PROVIDER_SITE_OTHER): Payer: Commercial Managed Care - PPO | Admitting: Gastroenterology

## 2023-03-08 VITALS — BP 145/72 | HR 81 | Temp 98.5°F | Ht 63.0 in | Wt 130.1 lb

## 2023-03-08 DIAGNOSIS — I10 Essential (primary) hypertension: Secondary | ICD-10-CM | POA: Insufficient documentation

## 2023-03-08 DIAGNOSIS — K5909 Other constipation: Secondary | ICD-10-CM | POA: Diagnosis not present

## 2023-03-08 DIAGNOSIS — R195 Other fecal abnormalities: Secondary | ICD-10-CM | POA: Insufficient documentation

## 2023-03-08 DIAGNOSIS — K5904 Chronic idiopathic constipation: Secondary | ICD-10-CM | POA: Insufficient documentation

## 2023-03-08 DIAGNOSIS — R1310 Dysphagia, unspecified: Secondary | ICD-10-CM

## 2023-03-08 DIAGNOSIS — R1319 Other dysphagia: Secondary | ICD-10-CM | POA: Insufficient documentation

## 2023-03-08 HISTORY — DX: Essential (primary) hypertension: I10

## 2023-03-08 MED ORDER — POLYETHYLENE GLYCOL 3350 17 G PO PACK: 17.0000 g | PACK | Freq: Two times a day (BID) | ORAL | 0 refills | Status: AC

## 2023-03-08 MED ORDER — PANTOPRAZOLE SODIUM 40 MG PO TBEC
40.0000 mg | DELAYED_RELEASE_TABLET | Freq: Every day | ORAL | 3 refills | Status: DC
  Filled 2023-04-07: qty 90, 90d supply, fill #0
  Filled 2023-06-29: qty 90, 90d supply, fill #1
  Filled 2023-09-28: qty 30, 30d supply, fill #2

## 2023-03-08 MED ORDER — PSYLLIUM 58.6 % PO PACK: 1.0000 | PACK | Freq: Two times a day (BID) | ORAL | 2 refills | Status: AC

## 2023-03-08 NOTE — Progress Notes (Signed)
Vista Lawman , M.D. Gastroenterology & Hepatology [facility] Gastroenterology [address] [place], [place] [address] Primary Care Physician: Rica Records, [place] [address] [place] [address]  Chief Complaint:  positive cologuard.  Constipation, dysphagia  History of Present Illness: Heather Arnold is a 62 y.o. female with HTN, who presents for evaluation of  positive cologuard.  Constipation and dysphagia.  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  She suffers from constipation with Bristol stool scale 1-3 bowel movement every 2 to 3 days with some straining and days with diarrhea.  Patient recently have noticed solid food dysphagia and feels as if food is slowing down in middle of her chest and feels full. The patient denies having any nausea, vomiting, fever, chills, hematochezia, melena, hematemesis, abdominal distention, abdominal pain, diarrhea, jaundice, pruritus or weight loss.  Last NWG:NFAO Last Colonoscopy:none  FHx: neg for any gastrointestinal/liver disease, father renal cancer Social: neg smoking, alcohol or illicit drug use Surgical: no abdominal surgeries  Labs on 02/15/2023 normal liver enzymes ferritin 47 vitamin D29 hemoglobin 13 positive Cologuard 02/24/2023  Past Medical History: Past Medical History:  Diagnosis Date   Arteriosclerotic coronary artery disease    Hiatal hernia    Kidney stones 10/16/2019   Ovarian cyst     Past Surgical History: Past Surgical History:  Procedure Laterality Date   CHOLECYSTECTOMY     CYSTOSCOPY WITH HOLMIUM LASER LITHOTRIPSY Left 10/16/2019   Procedure: CYSTOSCOPY WITH HOLMIUM LASER LITHOTRIPSY;  Surgeon: Malen Gauze, MD;  Location: [facility];  Service: Urology;  Laterality: Left;   CYSTOSCOPY WITH RETROGRADE PYELOGRAM, URETEROSCOPY AND STENT PLACEMENT Left 10/16/2019   Procedure: CYSTOSCOPY WITH RETROGRADE PYELOGRAM,  URETEROSCOPY AND STENT PLACEMENT;  Surgeon: Malen Gauze, MD;  Location: [facility];  Service: Urology;  Laterality: Left;  pt knows to arrive at 7:00   CYSTOSCOPY WITH STENT PLACEMENT  1989   THYROID SURGERY     TUBAL LIGATION     TUBAL LIGATION      Family History: Family History  Problem Relation Age of Onset   Kidney cancer Father    Heart failure Father    Thyroid disease Brother     Social History: Social History   Tobacco Use  Smoking Status Never   Passive exposure: Never  Smokeless Tobacco Never   Social History   Substance and Sexual Activity  Alcohol Use No   Social History   Substance and Sexual Activity  Drug Use No    Allergies: No Known Allergies  Medications: Current Outpatient Medications  Medication Sig Dispense Refill   alendronate (FOSAMAX) 70 MG tablet Take 70 mg by mouth once a week.     amLODipine (NORVASC) 2.5 MG tablet Take 1 tablet (2.5 mg total) by mouth daily. 30 tablet 3   Calcium Citrate-Vitamin D 315-250 MG-UNIT TABS Take 1 tablet by mouth daily.     cetirizine (ZYRTEC) 10 MG tablet Take 10 mg by mouth daily.     fluticasone (FLONASE) 50 MCG/ACT nasal spray Place 2 sprays into both nostrils daily. 16 g 2   pantoprazole (PROTONIX) 40 MG tablet Take 1 tablet (40 mg total) by mouth daily. 60 tablet 3   polyethylene glycol (MIRALAX / GLYCOLAX) 17 g packet Take 17 g by mouth 2 (two) times daily. 180 packet 0   psyllium (METAMUCIL) 58.6 % packet Take 1 packet by mouth 2 (two) times daily.  60 packet 2   No current facility-administered medications for this visit.    Review of Systems: GENERAL: negative for malaise, night sweats HEENT: No changes in hearing or vision, no nose bleeds or other nasal problems. NECK: Negative for lumps, goiter, pain and significant neck swelling RESPIRATORY: Negative for cough, wheezing CARDIOVASCULAR: Negative for chest pain, leg swelling, palpitations, orthopnea GI: SEE HPI MUSCULOSKELETAL: Negative  for joint pain or swelling, back pain, and muscle pain. SKIN: Negative for lesions, rash HEMATOLOGY Negative for prolonged bleeding, bruising easily, and swollen nodes. ENDOCRINE: Negative for cold or heat intolerance, polyuria, polydipsia and goiter. NEURO: negative for tremor, gait imbalance, syncope and seizures. The remainder of the review of systems is noncontributory.   Physical Exam: BP (!) 145/72   Pulse 81   Temp 98.5 F (36.9 C) (Oral)   Ht 5\' 3"  (1.6 m)   Wt 130 lb 1.6 oz (59 kg)   BMI 23.05 kg/m  GENERAL: The patient is AO x3, in no acute distress. HEENT: Head is normocephalic and atraumatic. EOMI are intact. Mouth is well hydrated and without lesions. NECK: Supple. No masses LUNGS: Clear to auscultation. No presence of rhonchi/wheezing/rales. Adequate chest expansion HEART: RRR, normal s1 and s2. ABDOMEN: Soft, nontender, no guarding, no peritoneal signs, and nondistended. BS +. No masses.   Imaging/Labs: as above     Latest Ref Rng & Units 02/15/2023    8:42 AM 10/07/2019    9:14 AM 10/06/2019    9:26 PM  CBC  WBC 3.4 - 10.8 x10E3/uL 5.6  11.3  12.5   Hemoglobin 11.1 - 15.9 g/dL 91.4  78.2  95.6   Hematocrit 34.0 - 46.6 % 40.9  39.8  39.9   Platelets 150 - 450 x10E3/uL 331  335  279    Lab Results  Component Value Date   IRON 70 02/15/2023   TIBC 281 02/15/2023   FERRITIN 47 02/15/2023    I personally reviewed and interpreted the available labs, imaging and endoscopic files.  Impression and Plan:  Heather Arnold is a 62 y.o. female with HTN, who presents for evaluation of  positive cologuard, Constipation and dysphagia.   #Intermittent solid food dysphagia  Patient has intermittent solid food dysphagia likely due to underlying hiatal hernia seen on imaging and GERD.  Although given age above 64 this is considered an alarm symptom and recommend upper endoscopy to evaluate for sequelae of chronic GERD such as Barrett's, stricture and also evaluated for  webb rings and malignancy  Protonix 40 mg 30 minutes before breakfast  #Positive cologuard  Patient does not have any high risk factors for colorectal cancer malignancy.  She has been asymptomatic. Discussed cologuard results in detail, specifically what it means when the test is positive or negative.  Discussed that there is a possibility that even when the test is positive there may not be a polyp found on colonoscopy. More than 50% of the office visit was dedicated to discussing the procedure, including the day of and risks involved. Patient understands what the procedure involves including the benefits and any risks. Patient understands alternatives to the proposed procedure. Risks including (but not limited to) bleeding, tearing of the lining (perforation), rupture of adjacent organs, problems with heart and lung function, infection, and medication reactions. A small percentage of complications may require surgery, hospitalization, repeat endoscopic procedure, and/or transfusion. A small percentage of polyps and other tumors may not be seen.  - Schedule colonoscopy  #Constipation  Patient has low dietary  intake of fiber and has chronic constipation  Ensure adequate fluid intake: Aim for 8 glasses of water daily. Follow a high fiber diet: Include foods such as dates, prunes, pears, and kiwi. Take Miralax twice a day for the first week, then reduce to once daily thereafter. Use Metamucil twice a day.  #HTN The patient was found to have elevated blood pressure when vital signs were checked in the office. The blood pressure was rechecked by the nursing staff and it was found be persistently elevated >140/90 mmHg. I personally advised to the patient to follow up closely with PCP for hypertension control.    All questions were answered.      Vista Lawman, MD Gastroenterology and Hepatology [facility] Gastroenterology   This chart has been completed using [facility] Dictation software, and while attempts have been made to ensure accuracy , certain words and phrases may not be transcribed as intended

## 2023-03-08 NOTE — H&P (View-Only) (Signed)
 Vista Lawman , M.D. Gastroenterology & Hepatology [facility] Gastroenterology [address] [place], [place] [address] Primary Care Physician: Rica Records, [place] [address] [place] [address]  Chief Complaint:  positive cologuard.  Constipation, dysphagia  History of Present Illness: Heather Arnold is a 62 y.o. female with HTN, who presents for evaluation of  positive cologuard.  Constipation and dysphagia.  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  She suffers from constipation with Bristol stool scale 1-3 bowel movement every 2 to 3 days with some straining and days with diarrhea.  Patient recently have noticed solid food dysphagia and feels as if food is slowing down in middle of her chest and feels full. The patient denies having any nausea, vomiting, fever, chills, hematochezia, melena, hematemesis, abdominal distention, abdominal pain, diarrhea, jaundice, pruritus or weight loss.  Last NWG:NFAO Last Colonoscopy:none  FHx: neg for any gastrointestinal/liver disease, father renal cancer Social: neg smoking, alcohol or illicit drug use Surgical: no abdominal surgeries  Labs on 02/15/2023 normal liver enzymes ferritin 47 vitamin D29 hemoglobin 13 positive Cologuard 02/24/2023  Past Medical History: Past Medical History:  Diagnosis Date   Arteriosclerotic coronary artery disease    Hiatal hernia    Kidney stones 10/16/2019   Ovarian cyst     Past Surgical History: Past Surgical History:  Procedure Laterality Date   CHOLECYSTECTOMY     CYSTOSCOPY WITH HOLMIUM LASER LITHOTRIPSY Left 10/16/2019   Procedure: CYSTOSCOPY WITH HOLMIUM LASER LITHOTRIPSY;  Surgeon: Malen Gauze, MD;  Location: [facility];  Service: Urology;  Laterality: Left;   CYSTOSCOPY WITH RETROGRADE PYELOGRAM, URETEROSCOPY AND STENT PLACEMENT Left 10/16/2019   Procedure: CYSTOSCOPY WITH RETROGRADE PYELOGRAM,  URETEROSCOPY AND STENT PLACEMENT;  Surgeon: Malen Gauze, MD;  Location: [facility];  Service: Urology;  Laterality: Left;  pt knows to arrive at 7:00   CYSTOSCOPY WITH STENT PLACEMENT  1989   THYROID SURGERY     TUBAL LIGATION     TUBAL LIGATION      Family History: Family History  Problem Relation Age of Onset   Kidney cancer Father    Heart failure Father    Thyroid disease Brother     Social History: Social History   Tobacco Use  Smoking Status Never   Passive exposure: Never  Smokeless Tobacco Never   Social History   Substance and Sexual Activity  Alcohol Use No   Social History   Substance and Sexual Activity  Drug Use No    Allergies: No Known Allergies  Medications: Current Outpatient Medications  Medication Sig Dispense Refill   alendronate (FOSAMAX) 70 MG tablet Take 70 mg by mouth once a week.     amLODipine (NORVASC) 2.5 MG tablet Take 1 tablet (2.5 mg total) by mouth daily. 30 tablet 3   Calcium Citrate-Vitamin D 315-250 MG-UNIT TABS Take 1 tablet by mouth daily.     cetirizine (ZYRTEC) 10 MG tablet Take 10 mg by mouth daily.     fluticasone (FLONASE) 50 MCG/ACT nasal spray Place 2 sprays into both nostrils daily. 16 g 2   pantoprazole (PROTONIX) 40 MG tablet Take 1 tablet (40 mg total) by mouth daily. 60 tablet 3   polyethylene glycol (MIRALAX / GLYCOLAX) 17 g packet Take 17 g by mouth 2 (two) times daily. 180 packet 0   psyllium (METAMUCIL) 58.6 % packet Take 1 packet by mouth 2 (two) times daily.  60 packet 2   No current facility-administered medications for this visit.    Review of Systems: GENERAL: negative for malaise, night sweats HEENT: No changes in hearing or vision, no nose bleeds or other nasal problems. NECK: Negative for lumps, goiter, pain and significant neck swelling RESPIRATORY: Negative for cough, wheezing CARDIOVASCULAR: Negative for chest pain, leg swelling, palpitations, orthopnea GI: SEE HPI MUSCULOSKELETAL: Negative  for joint pain or swelling, back pain, and muscle pain. SKIN: Negative for lesions, rash HEMATOLOGY Negative for prolonged bleeding, bruising easily, and swollen nodes. ENDOCRINE: Negative for cold or heat intolerance, polyuria, polydipsia and goiter. NEURO: negative for tremor, gait imbalance, syncope and seizures. The remainder of the review of systems is noncontributory.   Physical Exam: BP (!) 145/72   Pulse 81   Temp 98.5 F (36.9 C) (Oral)   Ht 5\' 3"  (1.6 m)   Wt 130 lb 1.6 oz (59 kg)   BMI 23.05 kg/m  GENERAL: The patient is AO x3, in no acute distress. HEENT: Head is normocephalic and atraumatic. EOMI are intact. Mouth is well hydrated and without lesions. NECK: Supple. No masses LUNGS: Clear to auscultation. No presence of rhonchi/wheezing/rales. Adequate chest expansion HEART: RRR, normal s1 and s2. ABDOMEN: Soft, nontender, no guarding, no peritoneal signs, and nondistended. BS +. No masses.   Imaging/Labs: as above     Latest Ref Rng & Units 02/15/2023    8:42 AM 10/07/2019    9:14 AM 10/06/2019    9:26 PM  CBC  WBC 3.4 - 10.8 x10E3/uL 5.6  11.3  12.5   Hemoglobin 11.1 - 15.9 g/dL 91.4  78.2  95.6   Hematocrit 34.0 - 46.6 % 40.9  39.8  39.9   Platelets 150 - 450 x10E3/uL 331  335  279    Lab Results  Component Value Date   IRON 70 02/15/2023   TIBC 281 02/15/2023   FERRITIN 47 02/15/2023    I personally reviewed and interpreted the available labs, imaging and endoscopic files.  Impression and Plan:  Heather Arnold is a 62 y.o. female with HTN, who presents for evaluation of  positive cologuard, Constipation and dysphagia.   #Intermittent solid food dysphagia  Patient has intermittent solid food dysphagia likely due to underlying hiatal hernia seen on imaging and GERD.  Although given age above 64 this is considered an alarm symptom and recommend upper endoscopy to evaluate for sequelae of chronic GERD such as Barrett's, stricture and also evaluated for  webb rings and malignancy  Protonix 40 mg 30 minutes before breakfast  #Positive cologuard  Patient does not have any high risk factors for colorectal cancer malignancy.  She has been asymptomatic. Discussed cologuard results in detail, specifically what it means when the test is positive or negative.  Discussed that there is a possibility that even when the test is positive there may not be a polyp found on colonoscopy. More than 50% of the office visit was dedicated to discussing the procedure, including the day of and risks involved. Patient understands what the procedure involves including the benefits and any risks. Patient understands alternatives to the proposed procedure. Risks including (but not limited to) bleeding, tearing of the lining (perforation), rupture of adjacent organs, problems with heart and lung function, infection, and medication reactions. A small percentage of complications may require surgery, hospitalization, repeat endoscopic procedure, and/or transfusion. A small percentage of polyps and other tumors may not be seen.  - Schedule colonoscopy  #Constipation  Patient has low dietary  intake of fiber and has chronic constipation  Ensure adequate fluid intake: Aim for 8 glasses of water daily. Follow a high fiber diet: Include foods such as dates, prunes, pears, and kiwi. Take Miralax twice a day for the first week, then reduce to once daily thereafter. Use Metamucil twice a day.  #HTN The patient was found to have elevated blood pressure when vital signs were checked in the office. The blood pressure was rechecked by the nursing staff and it was found be persistently elevated >140/90 mmHg. I personally advised to the patient to follow up closely with PCP for hypertension control.    All questions were answered.      Vista Lawman, MD Gastroenterology and Hepatology [facility] Gastroenterology   This chart has been completed using [facility] Dictation software, and while attempts have been made to ensure accuracy , certain words and phrases may not be transcribed as intended

## 2023-03-08 NOTE — Telephone Encounter (Signed)
P.t has been informed and will keep log. As well as schedule 4 mo f/u.

## 2023-03-08 NOTE — Patient Instructions (Signed)
It was very nice to meet you today, as dicussed with will plan for the following :  1) EGD and Colonoscopy  2) Ensure adequate fluid intake: Aim for 8 glasses of water daily. Follow a high fiber diet: Include foods such as dates, prunes, pears, and kiwi. Take Miralax twice a day for the first week, then reduce to once daily thereafter. Use Metamucil twice a day.  3) protonix 30 min before breakfast

## 2023-03-09 ENCOUNTER — Encounter (INDEPENDENT_AMBULATORY_CARE_PROVIDER_SITE_OTHER): Payer: Self-pay

## 2023-03-09 MED ORDER — PEG 3350-KCL-NA BICARB-NACL 420 G PO SOLR
4000.0000 mL | Freq: Once | ORAL | 0 refills | Status: AC
Start: 2023-03-09 — End: 2023-03-09

## 2023-03-09 NOTE — Addendum Note (Signed)
Addended by: Marlowe Shores on: 03/09/2023 08:06 AM   Modules accepted: Orders

## 2023-03-15 ENCOUNTER — Ambulatory Visit (HOSPITAL_COMMUNITY)
Admission: RE | Admit: 2023-03-15 | Discharge: 2023-03-15 | Disposition: A | Discharge status: Home / Self Care | Payer: Commercial Managed Care - PPO | Attending: Gastroenterology | Admitting: Gastroenterology

## 2023-03-15 ENCOUNTER — Ambulatory Visit (HOSPITAL_COMMUNITY): Payer: Commercial Managed Care - PPO | Admitting: Anesthesiology

## 2023-03-15 ENCOUNTER — Encounter (HOSPITAL_COMMUNITY): Payer: Self-pay

## 2023-03-15 ENCOUNTER — Encounter (HOSPITAL_COMMUNITY)
Admission: RE | Disposition: A | Discharge status: Home / Self Care | Payer: Self-pay | Source: Home / Self Care | Attending: Gastroenterology

## 2023-03-15 DIAGNOSIS — K5909 Other constipation: Secondary | ICD-10-CM | POA: Insufficient documentation

## 2023-03-15 DIAGNOSIS — K319 Disease of stomach and duodenum, unspecified: Secondary | ICD-10-CM | POA: Diagnosis not present

## 2023-03-15 DIAGNOSIS — R1310 Dysphagia, unspecified: Secondary | ICD-10-CM | POA: Insufficient documentation

## 2023-03-15 DIAGNOSIS — K644 Residual hemorrhoidal skin tags: Secondary | ICD-10-CM | POA: Diagnosis not present

## 2023-03-15 DIAGNOSIS — K2970 Gastritis, unspecified, without bleeding: Secondary | ICD-10-CM | POA: Diagnosis not present

## 2023-03-15 DIAGNOSIS — K3189 Other diseases of stomach and duodenum: Secondary | ICD-10-CM | POA: Insufficient documentation

## 2023-03-15 DIAGNOSIS — Z1211 Encounter for screening for malignant neoplasm of colon: Secondary | ICD-10-CM

## 2023-03-15 DIAGNOSIS — K5730 Diverticulosis of large intestine without perforation or abscess without bleeding: Secondary | ICD-10-CM | POA: Diagnosis not present

## 2023-03-15 DIAGNOSIS — K6389 Other specified diseases of intestine: Secondary | ICD-10-CM | POA: Diagnosis not present

## 2023-03-15 DIAGNOSIS — I10 Essential (primary) hypertension: Secondary | ICD-10-CM | POA: Diagnosis not present

## 2023-03-15 DIAGNOSIS — K529 Noninfective gastroenteritis and colitis, unspecified: Secondary | ICD-10-CM

## 2023-03-15 DIAGNOSIS — K317 Polyp of stomach and duodenum: Secondary | ICD-10-CM | POA: Diagnosis not present

## 2023-03-15 DIAGNOSIS — R195 Other fecal abnormalities: Secondary | ICD-10-CM | POA: Insufficient documentation

## 2023-03-15 DIAGNOSIS — K449 Diaphragmatic hernia without obstruction or gangrene: Secondary | ICD-10-CM | POA: Insufficient documentation

## 2023-03-15 DIAGNOSIS — Z79899 Other long term (current) drug therapy: Secondary | ICD-10-CM | POA: Insufficient documentation

## 2023-03-15 DIAGNOSIS — K648 Other hemorrhoids: Secondary | ICD-10-CM | POA: Diagnosis not present

## 2023-03-15 DIAGNOSIS — I2510 Atherosclerotic heart disease of native coronary artery without angina pectoris: Secondary | ICD-10-CM | POA: Insufficient documentation

## 2023-03-15 HISTORY — PX: ESOPHAGOGASTRODUODENOSCOPY (EGD) WITH PROPOFOL: SHX5813

## 2023-03-15 HISTORY — PX: BIOPSY: SHX5522

## 2023-03-15 HISTORY — PX: POLYPECTOMY: SHX5525

## 2023-03-15 HISTORY — PX: COLONOSCOPY WITH PROPOFOL: SHX5780

## 2023-03-15 SURGERY — ESOPHAGOGASTRODUODENOSCOPY (EGD) WITH PROPOFOL
Anesthesia: General

## 2023-03-15 MED ORDER — PROPOFOL 500 MG/50ML IV EMUL
INTRAVENOUS | Status: DC | PRN
  Administered 2023-03-15: 200 ug/kg/min via INTRAVENOUS

## 2023-03-15 MED ORDER — PROPOFOL 10 MG/ML IV BOLUS
INTRAVENOUS | Status: DC | PRN
  Administered 2023-03-15: 100 mg via INTRAVENOUS

## 2023-03-15 MED ORDER — LIDOCAINE HCL (CARDIAC) PF 100 MG/5ML IV SOSY
PREFILLED_SYRINGE | INTRAVENOUS | Status: DC | PRN
  Administered 2023-03-15: 50 mg via INTRAVENOUS

## 2023-03-15 MED ORDER — LACTATED RINGERS IV SOLN: INTRAVENOUS | Status: DC | PRN

## 2023-03-15 NOTE — Transfer of Care (Signed)
Immediate Anesthesia Transfer of Care Note  Patient: Heather Arnold  Procedure(s) Performed: ESOPHAGOGASTRODUODENOSCOPY (EGD) WITH PROPOFOL COLONOSCOPY WITH PROPOFOL BIOPSY POLYPECTOMY  Patient Location: Short Stay  Anesthesia Type:General  Level of Consciousness: awake, alert , oriented, and patient cooperative  Airway & Oxygen Therapy: Patient Spontanous Breathing  Post-op Assessment: Report given to RN, Post -op Vital signs reviewed and stable, and Patient moving all extremities X 4  Post vital signs: Reviewed and stable  Last Vitals:  Vitals Value Taken Time  BP 97/39 03/15/23 1050  Temp    Pulse 65 03/15/23 1050  Resp 13 03/15/23 1050  SpO2 98 % 03/15/23 1050    Last Pain:  Vitals:   03/15/23 1050  TempSrc:   PainSc: 0-No pain      Patients Stated Pain Goal: 7 (03/15/23 0933)  Complications: No notable events documented.

## 2023-03-15 NOTE — Interval H&P Note (Signed)
History and Physical Interval Note:  03/15/2023 9:56 AM  Heather Arnold  has presented today for surgery, with the diagnosis of DYSPHAGIA, POSITIVE COLOGUARD.  The various methods of treatment have been discussed with the patient and family. After consideration of risks, benefits and other options for treatment, the patient has consented to  Procedure(s) with comments: ESOPHAGOGASTRODUODENOSCOPY (EGD) WITH PROPOFOL (N/A) - 11:00AM;ASA 1-2 COLONOSCOPY WITH PROPOFOL (N/A) - 11:00AM;ASA 1-2 as a surgical intervention.  The patient's history has been reviewed, patient examined, no change in status, stable for surgery.  I have reviewed the patient's chart and labs.  Questions were answered to the patient's satisfaction.     Juanetta Beets Danila Eddie

## 2023-03-15 NOTE — Anesthesia Preprocedure Evaluation (Signed)
Anesthesia Evaluation  Patient identified by MRN, date of birth, ID band Patient awake    Reviewed: Allergy & Precautions, H&P , NPO status , Patient's Chart, lab work & pertinent test results, reviewed documented beta blocker date and time   Airway Mallampati: II  TM Distance: >3 FB Neck ROM: full    Dental no notable dental hx.    Pulmonary neg pulmonary ROS   Pulmonary exam normal breath sounds clear to auscultation       Cardiovascular Exercise Tolerance: Good hypertension, + CAD  negative cardio ROS  Rhythm:regular Rate:Normal     Neuro/Psych  Neuromuscular disease negative neurological ROS  negative psych ROS   GI/Hepatic negative GI ROS, Neg liver ROS, hiatal hernia,,,  Endo/Other  negative endocrine ROS    Renal/GU Renal diseasenegative Renal ROS  negative genitourinary   Musculoskeletal   Abdominal   Peds  Hematology negative hematology ROS (+)   Anesthesia Other Findings   Reproductive/Obstetrics negative OB ROS                             Anesthesia Physical Anesthesia Plan  ASA: 2  Anesthesia Plan: General   Post-op Pain Management:    Induction:   PONV Risk Score and Plan: Propofol infusion  Airway Management Planned:   Additional Equipment:   Intra-op Plan:   Post-operative Plan:   Informed Consent: I have reviewed the patients History and Physical, chart, labs and discussed the procedure including the risks, benefits and alternatives for the proposed anesthesia with the patient or authorized representative who has indicated his/her understanding and acceptance.     Dental Advisory Given  Plan Discussed with: CRNA  Anesthesia Plan Comments:        Anesthesia Quick Evaluation

## 2023-03-15 NOTE — Discharge Instructions (Signed)

## 2023-03-15 NOTE — Op Note (Signed)
[facility] Patient Name: Heather Arnold Procedure Date: 03/15/2023 10:03 AM MRN: [id number] Date of Birth: Jan 22, 1961 Attending MD: Sanjuan Dame , MD, [id number] CSN: [id number] Age: 62 Admit Type: Outpatient Procedure:                Upper GI endoscopy Indications:              Dysphagia Providers:                Sanjuan Dame, MD, Edrick Kins, RN, Elinor Parkinson Referring MD:             Sanjuan Dame, MD Medicines:                Monitored Anesthesia Care Complications:            No immediate complications. Estimated Blood Loss:     Estimated blood loss was minimal. Procedure:                Pre-Anesthesia Assessment:                           - Prior to the procedure, a History and Physical                            was performed, and patient medications and                            allergies were reviewed. The patient's tolerance of                            previous anesthesia was also reviewed. The risks                            and benefits of the procedure and the sedation                            options and risks were discussed with the patient.                            All questions were answered, and informed consent                            was obtained. Prior Anticoagulants: The patient has                            taken no anticoagulant or antiplatelet agents. ASA                            Grade Assessment: II - A patient with mild systemic                            disease. After reviewing the risks and benefits,  the patient was deemed in satisfactory condition to                            undergo the procedure.                           After obtaining informed consent, the endoscope was                            passed under direct vision. Throughout the                            procedure, the patient's blood pressure, pulse, and                            oxygen saturations were  monitored continuously. The                            [id number]-[id number] ([id number]) scope was introduced through the                            mouth, and advanced to the second part of duodenum.                            The upper GI endoscopy was accomplished without                            difficulty. The patient tolerated the procedure                            well. Scope In: 10:14:26 AM Scope Out: 10:19:46 AM Total Procedure Duration: 0 hours 5 minutes 20 seconds  Findings:      Esophagogastric landmarks were identified: the Z-line was found at 32 cm       and the gastroesophageal junction was found at 36 cm from the incisors.      A 4 cm hiatal hernia was present.      A single 7 mm sessile polyp with no bleeding and no stigmata of recent       bleeding was found in the gastric fundus. The polyp was removed with a       cold snare. Resection and retrieval were complete.      Mildly erythematous mucosa without bleeding was found in the gastric       antrum. Biopsies were taken with a cold forceps for histology.      The duodenal bulb and second portion of the duodenum were normal.       Biopsies were taken with a cold forceps for histology. Impression:               - Esophagogastric landmarks identified.                           - 4 cm hiatal hernia.                           - A single gastric polyp. Resected and retrieved.                           -  Erythematous mucosa in the antrum. Biopsied.                           - Normal duodenal bulb and second portion of the                            duodenum. Biopsied. Moderate Sedation:      Per Anesthesia Care Recommendation:           - Patient has a contact number available for                            emergencies. The signs and symptoms of potential                            delayed complications were discussed with the                            patient. Return to normal activities tomorrow.                            Written  discharge instructions were provided to the                            patient.                           - Resume previous diet.                           - Continue present medications.                           - Await pathology results. Procedure Code(s):        --- Professional ---                           [id number], Esophagogastroduodenoscopy, flexible,                            transoral; with removal of tumor(s), polyp(s), or                            other lesion(s) by snare technique                           43239, 59, Esophagogastroduodenoscopy, flexible,                            transoral; with biopsy, single or multiple Diagnosis Code(s):        --- Professional ---                           K44.9, Diaphragmatic hernia without obstruction or                            gangrene  K31.7, Polyp of stomach and duodenum                           K31.89, Other diseases of stomach and duodenum                           R13.10, Dysphagia, unspecified CPT copyright 2022 American [employer] Association. All rights reserved. The codes documented in this report are preliminary and upon coder review may  be revised to meet current compliance requirements. Sanjuan Dame, MD Sanjuan Dame, MD 03/15/2023 10:49:44 AM This report has been signed electronically. Number of Addenda: 0

## 2023-03-15 NOTE — Op Note (Signed)
[facility] Patient Name: Heather Arnold Procedure Date: 03/15/2023 10:03 AM MRN: [id number] Date of Birth: Apr 24, 1960 Attending MD: Sanjuan Dame , MD, [id number] CSN: [id number] Age: 62 Admit Type: Outpatient Procedure:                Colonoscopy Indications:              Positive Cologuard test Providers:                Sanjuan Dame, MD, Edrick Kins, RN, Elinor Parkinson Referring MD:             Sanjuan Dame, MD Medicines:                Monitored Anesthesia Care Complications:            No immediate complications. Estimated Blood Loss:     Estimated blood loss was minimal. Procedure:                Pre-Anesthesia Assessment:                           - Prior to the procedure, a History and Physical                            was performed, and patient medications and                            allergies were reviewed. The patient's tolerance of                            previous anesthesia was also reviewed. The risks                            and benefits of the procedure and the sedation                            options and risks were discussed with the patient.                            All questions were answered, and informed consent                            was obtained. Prior Anticoagulants: The patient has                            taken no anticoagulant or antiplatelet agents. ASA                            Grade Assessment: II - A patient with mild systemic                            disease. After reviewing the risks and benefits,  the patient was deemed in satisfactory condition to                            undergo the procedure.                           After obtaining informed consent, the colonoscope                            was passed under direct vision. Throughout the                            procedure, the patient's blood pressure, pulse, and                            oxygen  saturations were monitored continuously. The                            [id number]) scope was introduced through the                            anus and advanced to the the terminal ileum. The                            colonoscopy was performed without difficulty. The                            patient tolerated the procedure well. The quality                            of the bowel preparation was evaluated using the                            BBPS [facility] Bowel Preparation Scale) with scores                            of: Right Colon = 3, Transverse Colon = 3 and Left                            Colon = 3 (entire mucosa seen well with no residual                            staining, small fragments of stool or opaque                            liquid). The total BBPS score equals 9. The                            terminal ileum, ileocecal valve, appendiceal                            orifice, and rectum were photographed. Scope In: 10:24:08 AM Scope Out: 10:46:05 AM Scope Withdrawal Time: 0 hours 17 minutes  21 seconds  Total Procedure Duration: 0 hours 21 minutes 57 seconds  Findings:      A diffuse area of nodular mucosa was found in the ascending colon.       Biopsies were taken with a cold forceps for histology.      Scattered diverticula were found in the entire colon.      A diffuse area of nodular mucosa was found in the recto-sigmoid colon.       Biopsies were taken with a cold forceps for histology.      Non-bleeding external and internal hemorrhoids were found during       retroflexion. The hemorrhoids were small. Impression:               - Nodular mucosa in the ascending colon. Biopsied.                           - Diverticulosis in the entire examined colon.                           - Nodular mucosa in the recto-sigmoid colon.                            Biopsied.                           - Non-bleeding external and internal hemorrhoids. Moderate Sedation:       Per Anesthesia Care Recommendation:           - Patient has a contact number available for                            emergencies. The signs and symptoms of potential                            delayed complications were discussed with the                            patient. Return to normal activities tomorrow.                            Written discharge instructions were provided to the                            patient.                           - Resume previous diet.                           - Continue present medications.                           - Await pathology results.                           - Repeat colonoscopy in 5 years for surveillance  based on pathology results.                           - Return to primary care physician as previously                            scheduled. Procedure Code(s):        --- Professional ---                           [phone], Colonoscopy, flexible; with biopsy, single                            or multiple Diagnosis Code(s):        --- Professional ---                           K63.89, Other specified diseases of intestine                           K64.8, Other hemorrhoids                           R19.5, Other fecal abnormalities                           K57.30, Diverticulosis of large intestine without                            perforation or abscess without bleeding CPT copyright 2022 American Medical Association. All rights reserved. The codes documented in this report are preliminary and upon coder review may  be revised to meet current compliance requirements. Sanjuan Dame, MD Sanjuan Dame, MD 03/15/2023 10:53:49 AM This report has been signed electronically. Number of Addenda: 0

## 2023-03-16 ENCOUNTER — Encounter (INDEPENDENT_AMBULATORY_CARE_PROVIDER_SITE_OTHER): Payer: Self-pay | Admitting: *Deleted

## 2023-03-16 LAB — SURGICAL PATHOLOGY

## 2023-03-19 ENCOUNTER — Encounter (HOSPITAL_COMMUNITY): Payer: Self-pay | Admitting: Gastroenterology

## 2023-03-22 ENCOUNTER — Encounter (INDEPENDENT_AMBULATORY_CARE_PROVIDER_SITE_OTHER): Payer: Self-pay | Admitting: *Deleted

## 2023-03-23 NOTE — Anesthesia Postprocedure Evaluation (Signed)
Anesthesia Post Note  Patient: ALIHA VANVACTOR  Procedure(s) Performed: ESOPHAGOGASTRODUODENOSCOPY (EGD) WITH PROPOFOL COLONOSCOPY WITH PROPOFOL BIOPSY POLYPECTOMY  Patient location during evaluation: Phase II Anesthesia Type: General Level of consciousness: awake Pain management: pain level controlled Vital Signs Assessment: post-procedure vital signs reviewed and stable Respiratory status: spontaneous breathing and respiratory function stable Cardiovascular status: blood pressure returned to baseline and stable Postop Assessment: no headache and no apparent nausea or vomiting Anesthetic complications: no Comments: Late entry   No notable events documented.   Last Vitals:  Vitals:   03/15/23 1050 03/15/23 1053  BP: (!) 97/39 96/77  Pulse: 65 63  Resp: 13 20  Temp:  (!) 36.3 C  SpO2: 98% 98%    Last Pain:  Vitals:   03/15/23 1053  TempSrc: Axillary  PainSc: 0-No pain                 Windell Norfolk

## 2023-03-23 NOTE — Anesthesia Postprocedure Evaluation (Signed)
Anesthesia Post Note  Patient: Heather Arnold  Procedure(s) Performed: ESOPHAGOGASTRODUODENOSCOPY (EGD) WITH PROPOFOL COLONOSCOPY WITH PROPOFOL BIOPSY POLYPECTOMY  Patient location during evaluation: Phase II Anesthesia Type: General Level of consciousness: awake Pain management: pain level controlled Vital Signs Assessment: post-procedure vital signs reviewed and stable Respiratory status: spontaneous breathing and respiratory function stable Cardiovascular status: blood pressure returned to baseline and stable Postop Assessment: no headache and no apparent nausea or vomiting Anesthetic complications: no Comments: Late entry   No notable events documented.   Last Vitals:  Vitals:   03/15/23 1050 03/15/23 1053  BP: (!) 97/39 96/77  Pulse: 65 63  Resp: 13 20  Temp:  (!) 36.3 C  SpO2: 98% 98%    Last Pain:  Vitals:   03/15/23 1053  TempSrc: Axillary  PainSc: 0-No pain                 Windell Norfolk

## 2023-04-05 ENCOUNTER — Other Ambulatory Visit (HOSPITAL_COMMUNITY): Payer: Self-pay

## 2023-04-06 ENCOUNTER — Other Ambulatory Visit: Payer: Self-pay

## 2023-04-06 ENCOUNTER — Other Ambulatory Visit (HOSPITAL_COMMUNITY): Payer: Self-pay

## 2023-04-07 ENCOUNTER — Other Ambulatory Visit: Payer: Self-pay

## 2023-04-07 ENCOUNTER — Other Ambulatory Visit (HOSPITAL_COMMUNITY): Payer: Self-pay

## 2023-04-08 ENCOUNTER — Other Ambulatory Visit (HOSPITAL_COMMUNITY): Payer: Self-pay

## 2023-04-12 ENCOUNTER — Other Ambulatory Visit (HOSPITAL_COMMUNITY): Payer: Self-pay

## 2023-06-29 ENCOUNTER — Other Ambulatory Visit (HOSPITAL_COMMUNITY): Payer: Self-pay

## 2023-06-29 ENCOUNTER — Other Ambulatory Visit: Payer: Self-pay | Admitting: Family Medicine

## 2023-06-29 ENCOUNTER — Other Ambulatory Visit: Payer: Self-pay

## 2023-06-29 MED ORDER — AMLODIPINE BESYLATE 2.5 MG PO TABS
2.5000 mg | ORAL_TABLET | Freq: Every day | ORAL | 3 refills | Status: DC
  Filled 2023-06-29: qty 30, 30d supply, fill #0

## 2023-07-02 ENCOUNTER — Ambulatory Visit: Payer: Commercial Managed Care - PPO | Admitting: Family Medicine

## 2023-07-07 DIAGNOSIS — M25552 Pain in left hip: Secondary | ICD-10-CM | POA: Diagnosis not present

## 2023-07-19 ENCOUNTER — Other Ambulatory Visit (HOSPITAL_BASED_OUTPATIENT_CLINIC_OR_DEPARTMENT_OTHER): Payer: Self-pay

## 2023-07-19 ENCOUNTER — Other Ambulatory Visit (HOSPITAL_COMMUNITY): Payer: Self-pay

## 2023-07-19 DIAGNOSIS — Z1151 Encounter for screening for human papillomavirus (HPV): Secondary | ICD-10-CM | POA: Diagnosis not present

## 2023-07-19 DIAGNOSIS — Z124 Encounter for screening for malignant neoplasm of cervix: Secondary | ICD-10-CM | POA: Diagnosis not present

## 2023-07-19 DIAGNOSIS — Z01419 Encounter for gynecological examination (general) (routine) without abnormal findings: Secondary | ICD-10-CM | POA: Diagnosis not present

## 2023-07-19 DIAGNOSIS — Z13820 Encounter for screening for osteoporosis: Secondary | ICD-10-CM | POA: Diagnosis not present

## 2023-07-19 DIAGNOSIS — Z6824 Body mass index (BMI) 24.0-24.9, adult: Secondary | ICD-10-CM | POA: Diagnosis not present

## 2023-07-19 MED ORDER — ALENDRONATE SODIUM 70 MG PO TABS
70.0000 mg | ORAL_TABLET | ORAL | 3 refills | Status: AC
  Filled 2023-07-19: qty 12, 84d supply, fill #0
  Filled 2023-10-10: qty 12, 84d supply, fill #1
  Filled 2024-02-22: qty 12, 84d supply, fill #2

## 2023-07-30 ENCOUNTER — Encounter: Payer: Self-pay | Admitting: Family Medicine

## 2023-07-30 ENCOUNTER — Other Ambulatory Visit (HOSPITAL_COMMUNITY): Payer: Self-pay

## 2023-07-30 ENCOUNTER — Ambulatory Visit: Admitting: Family Medicine

## 2023-07-30 VITALS — BP 132/72 | HR 71 | Temp 98.2°F | Ht 62.0 in | Wt 131.1 lb

## 2023-07-30 DIAGNOSIS — R7303 Prediabetes: Secondary | ICD-10-CM | POA: Diagnosis not present

## 2023-07-30 DIAGNOSIS — I10 Essential (primary) hypertension: Secondary | ICD-10-CM

## 2023-07-30 MED ORDER — AMLODIPINE BESYLATE 2.5 MG PO TABS
2.5000 mg | ORAL_TABLET | Freq: Every day | ORAL | 1 refills | Status: DC
  Filled 2023-07-30 (×2): qty 90, 90d supply, fill #0
  Filled 2023-10-28: qty 90, 90d supply, fill #1

## 2023-07-30 MED ORDER — AMLODIPINE BESYLATE 2.5 MG PO TABS
2.5000 mg | ORAL_TABLET | Freq: Every day | ORAL | 1 refills | Status: DC
  Filled 2023-07-30: qty 90, 90d supply, fill #0

## 2023-07-30 NOTE — Assessment & Plan Note (Signed)
 Vitals:   07/30/23 1332  BP: 132/72   Continue Amlodipine  2.5 mg once daily Labs ordered. Discussed with  patient to monitor their blood pressure regularly and maintain a heart-healthy diet rich in fruits, vegetables, whole grains, and low-fat dairy, while reducing sodium intake to less than 2,300 mg per day. Regular physical activity, such as 30 minutes of moderate exercise most days of the week, will help lower blood pressure and improve overall cardiovascular health. Avoiding smoking, limiting alcohol consumption, and managing stress. Take  prescribed medication, & take it as directed and avoid skipping doses. Seek emergency care if your blood pressure is (over 180/100) or you experience chest pain, shortness of breath, or sudden vision changes.Patient verbalizes understanding regarding plan of care and all questions answered. Follow up in   weeks with at home blood pressure to monitor trends.

## 2023-07-30 NOTE — Patient Instructions (Signed)

## 2023-07-30 NOTE — Progress Notes (Signed)
 Established Patient Office Visit   Subjective  Patient ID: Heather Arnold, female    DOB: 15-Sep-1960  Age: 63 y.o. MRN: [id number]  Chief Complaint  Patient presents with   Medication follow up    Follow up since she began taking BP medication.    She  has a past medical history of Arteriosclerotic coronary artery disease, Essential hypertension (03/08/2023), Hiatal hernia, Kidney stones (10/16/2019), and Ovarian cyst.  HPI Patient presents to the clinic for hypertension follow up. For the details of today's visit, please refer to assessment and plan.   Review of Systems  Constitutional:  Negative for chills and fever.  Eyes:  Negative for blurred vision.  Respiratory:  Negative for cough and shortness of breath.   Cardiovascular:  Negative for chest pain.  Neurological:  Negative for dizziness and headaches.      Objective:     BP 132/72   Pulse 71   Temp 98.2 F (36.8 C) (Oral)   Ht 5\' 2"  (1.575 m)   Wt 131 lb 1.9 oz (59.5 kg)   SpO2 96%   BMI 23.98 kg/m  BP Readings from Last 3 Encounters:  07/30/23 132/72  03/15/23 96/77  03/08/23 (!) 145/72      Physical Exam Vitals reviewed.  Constitutional:      General: She is not in acute distress.    Appearance: Normal appearance. She is not ill-appearing, toxic-appearing or diaphoretic.  HENT:     Head: Normocephalic.  Eyes:     General:        Right eye: No discharge.        Left eye: No discharge.     Conjunctiva/sclera: Conjunctivae normal.  Cardiovascular:     Rate and Rhythm: Normal rate.     Pulses: Normal pulses.     Heart sounds: Normal heart sounds.  Pulmonary:     Effort: Pulmonary effort is normal. No respiratory distress.     Breath sounds: Normal breath sounds.  Abdominal:     General: Bowel sounds are normal.     Palpations: Abdomen is soft.     Tenderness: There is no abdominal tenderness. There is no right CVA tenderness, left CVA tenderness or guarding.  Skin:    General: Skin is warm and  dry.     Capillary Refill: Capillary refill takes less than 2 seconds.  Neurological:     Mental Status: She is alert.     Coordination: Coordination normal.     Gait: Gait normal.  Psychiatric:        Mood and Affect: Mood normal.        Behavior: Behavior normal.        Thought Content: Thought content normal.      No results found for any visits on 07/30/23.  The 10-year ASCVD risk score (Arnett DK, et al., 2019) is: 7%    Assessment & Plan:  Primary hypertension -     BMP8+eGFR -     CBC with Differential/Platelet -     Lipid panel  Prediabetes -     Hemoglobin A1c  Essential hypertension Assessment & Plan: Vitals:   07/30/23 1332  BP: 132/72   Continue Amlodipine  2.5 mg once daily Labs ordered. Discussed with  patient to monitor their blood pressure regularly and maintain a heart-healthy diet rich in fruits, vegetables, whole grains, and low-fat dairy, while reducing sodium intake to less than 2,300 mg per day. Regular physical activity, such as 30 minutes of moderate exercise  most days of the week, will help lower blood pressure and improve overall cardiovascular health. Avoiding smoking, limiting alcohol consumption, and managing stress. Take  prescribed medication, & take it as directed and avoid skipping doses. Seek emergency care if your blood pressure is (over 180/100) or you experience chest pain, shortness of breath, or sudden vision changes.Patient verbalizes understanding regarding plan of care and all questions answered. Follow up in   weeks with at home blood pressure to monitor trends.    Other orders -     amLODIPine  Besylate; Take 1 tablet (2.5 mg total) by mouth daily.  Dispense: 90 tablet; Refill: 1    Return in about 6 months (around 01/30/2024), or if symptoms worsen or fail to improve, for hypertension, hyperlipidemia, pre-diabetes.   Avelino Lek Amber Bail, FNP

## 2023-07-31 LAB — CBC WITH DIFFERENTIAL/PLATELET
Basophils Absolute: 0.1 10*3/uL (ref 0.0–0.2)
Basos: 1 %
EOS (ABSOLUTE): 0.2 10*3/uL (ref 0.0–0.4)
Eos: 3 %
Hematocrit: 43 % (ref 34.0–46.6)
Hemoglobin: 13.9 g/dL (ref 11.1–15.9)
Immature Grans (Abs): 0 10*3/uL (ref 0.0–0.1)
Immature Granulocytes: 0 %
Lymphocytes Absolute: 1.9 10*3/uL (ref 0.7–3.1)
Lymphs: 33 %
MCH: 29.4 pg (ref 26.6–33.0)
MCHC: 32.3 g/dL (ref 31.5–35.7)
MCV: 91 fL (ref 79–97)
Monocytes Absolute: 0.6 10*3/uL (ref 0.1–0.9)
Monocytes: 11 %
Neutrophils Absolute: 2.9 10*3/uL (ref 1.4–7.0)
Neutrophils: 52 %
Platelets: 326 10*3/uL (ref 150–450)
RBC: 4.73 x10E6/uL (ref 3.77–5.28)
RDW: 12.9 % (ref 11.7–15.4)
WBC: 5.7 10*3/uL (ref 3.4–10.8)

## 2023-07-31 LAB — BMP8+EGFR
BUN/Creatinine Ratio: 12 (ref 12–28)
BUN: 11 mg/dL (ref 8–27)
CO2: 23 mmol/L (ref 20–29)
Calcium: 9.1 mg/dL (ref 8.7–10.3)
Chloride: 103 mmol/L (ref 96–106)
Creatinine, Ser: 0.89 mg/dL (ref 0.57–1.00)
Glucose: 92 mg/dL (ref 70–99)
Potassium: 4.5 mmol/L (ref 3.5–5.2)
Sodium: 140 mmol/L (ref 134–144)
eGFR: 72 mL/min/{1.73_m2} (ref >59)

## 2023-07-31 LAB — LIPID PANEL
Chol/HDL Ratio: 3.5 ratio (ref 0.0–4.4)
Cholesterol, Total: 204 mg/dL — ABNORMAL HIGH (ref 100–199)
HDL: 59 mg/dL (ref >39)
LDL Chol Calc (NIH): 129 mg/dL — ABNORMAL HIGH (ref 0–99)
Triglycerides: 91 mg/dL (ref 0–149)
VLDL Cholesterol Cal: 16 mg/dL (ref 5–40)

## 2023-07-31 LAB — HEMOGLOBIN A1C
Est. average glucose Bld gHb Est-mCnc: 123 mg/dL
Hgb A1c MFr Bld: 5.9 % — ABNORMAL HIGH (ref 4.8–5.6)

## 2023-08-01 ENCOUNTER — Ambulatory Visit
Admission: EM | Admit: 2023-08-01 | Discharge: 2023-08-01 | Disposition: A | Discharge status: Home / Self Care | Attending: Family Medicine | Admitting: Family Medicine

## 2023-08-01 DIAGNOSIS — L249 Irritant contact dermatitis, unspecified cause: Secondary | ICD-10-CM

## 2023-08-01 MED ORDER — METHYLPREDNISOLONE SODIUM SUCC 125 MG IJ SOLR
125.0000 mg | Freq: Once | INTRAMUSCULAR | Status: AC
  Administered 2023-08-01: 125 mg via INTRAMUSCULAR

## 2023-08-01 MED ORDER — TRIAMCINOLONE ACETONIDE 0.1 % EX CREA: 1.0000 | TOPICAL_CREAM | Freq: Two times a day (BID) | CUTANEOUS | 0 refills | Status: DC

## 2023-08-01 NOTE — ED Triage Notes (Signed)
 Pt reports rash that it itchy, red and painful torso, thighs, groin and under tha arms x 2 days. Started Friday on the bra line then progressed into other areas, pt denies changes in foods detergents and hygiene products

## 2023-08-01 NOTE — ED Provider Notes (Signed)
 [facility]    CSN: [id number] Arrival date & time: 08/01/23  4098      History   Chief Complaint No chief complaint on file.   HPI Heather Arnold is a 63 y.o. female.   Patient presenting today with 2-day history of intensely itchy rash that started under her bra line and is now spread to her abdomen, chest, back, legs.  Denies known exposures to any new cleaning products, foods, medications and states she was working outside about a week ago but has had no issues in the 1 week since up till now.  So far trying Benadryl with minimal relief.  Denies throat itching, swelling, chest tightness, nausea, vomiting.     Past Medical History:  Diagnosis Date   Arteriosclerotic coronary artery disease    Essential hypertension 03/08/2023   Hiatal hernia    Kidney stones 10/16/2019   Ovarian cyst     Patient Active Problem List   Diagnosis Date Noted   Gastritis and gastroduodenitis 03/15/2023   Gastric polyp 03/15/2023   Noninfectious gastroenteritis 03/15/2023   Esophageal dysphagia 03/08/2023   Chronic idiopathic constipation 03/08/2023   Positive colorectal cancer screening using Cologuard test 03/08/2023   Essential hypertension 03/08/2023   Elevated blood pressure reading 02/12/2023   Thyroid  nodule 02/12/2023   Kidney stones 10/16/2019   Hypokalemia 01/16/2011   Bradycardia 01/16/2011   Chest pain 01/15/2011   Vomiting 01/15/2011   Heartburn 01/15/2011    Past Surgical History:  Procedure Laterality Date   BIOPSY  03/15/2023   Procedure: BIOPSY;  Surgeon: Hargis Lias, MD;  Location: [facility];  Service: Endoscopy;;  upper and lower   CHOLECYSTECTOMY     COLONOSCOPY WITH PROPOFOL  N/A 03/15/2023   Procedure: COLONOSCOPY WITH PROPOFOL ;  Surgeon: Hargis Lias, MD;  Location: [facility];  Service: Endoscopy;  Laterality: N/A;  11:00AM;ASA 1-2   CYSTOSCOPY WITH HOLMIUM LASER LITHOTRIPSY Left 10/16/2019   Procedure: CYSTOSCOPY WITH HOLMIUM LASER  LITHOTRIPSY;  Surgeon: Marco Severs, MD;  Location: [facility];  Service: Urology;  Laterality: Left;   CYSTOSCOPY WITH RETROGRADE PYELOGRAM, URETEROSCOPY AND STENT PLACEMENT Left 10/16/2019   Procedure: CYSTOSCOPY WITH RETROGRADE PYELOGRAM, URETEROSCOPY AND STENT PLACEMENT;  Surgeon: Marco Severs, MD;  Location: [facility];  Service: Urology;  Laterality: Left;  pt knows to arrive at 7:00   CYSTOSCOPY WITH STENT PLACEMENT  1989   ESOPHAGOGASTRODUODENOSCOPY (EGD) WITH PROPOFOL  N/A 03/15/2023   Procedure: ESOPHAGOGASTRODUODENOSCOPY (EGD) WITH PROPOFOL ;  Surgeon: Hargis Lias, MD;  Location: [facility];  Service: Endoscopy;  Laterality: N/A;  11:00AM;ASA 1-2   POLYPECTOMY  03/15/2023   Procedure: POLYPECTOMY;  Surgeon: Hargis Lias, MD;  Location: [facility];  Service: Endoscopy;;   THYROID  SURGERY     TUBAL LIGATION     TUBAL LIGATION      OB History   No obstetric history on file.      Home Medications    Prior to Admission medications   Medication Sig Start Date End Date Taking? Authorizing Provider  triamcinolone  cream (KENALOG ) 0.1 % Apply 1 Application topically 2 (two) times daily. Avoid use on face or private areas 08/01/23  Yes Corbin Dess, PA-C  alendronate  (FOSAMAX ) 70 MG tablet Take 1 tablet  (70 mg) by mouth each week 30 min prior to breakfast with large glass of water . Remain upright. 07/19/23     amLODipine  (NORVASC ) 2.5 MG tablet Take 1 tablet (2.5 mg total) by mouth daily.  07/30/23   Del Orbe Polanco, Iliana, FNP  Calcium Citrate-Vitamin D  315-250 MG-UNIT TABS Take 1 tablet by mouth daily.    [provider]  cetirizine (ZYRTEC) 10 MG tablet Take 10 mg by mouth daily.    [provider]  fluticasone  (FLONASE ) 50 MCG/ACT nasal spray Place 2 sprays into both nostrils daily. Patient not taking: Reported on 07/30/2023 11/18/19   Jerri Morale A, FNP  pantoprazole  (PROTONIX ) 40 MG tablet Take 1 tablet (40 mg total) by mouth daily.  03/08/23   Ahmed, Forbes Ida, MD    Family History Family History  Problem Relation Age of Onset   Kidney cancer Father    Heart failure Father    Thyroid  disease Brother     Social History Social History   Tobacco Use   Smoking status: Never    Passive exposure: Never   Smokeless tobacco: Never  Vaping Use   Vaping status: Never Used  Substance Use Topics   Alcohol use: No   Drug use: No     Allergies   Patient has no known allergies.   Review of Systems Review of Systems PER HPI  Physical Exam Triage Vital Signs ED Triage Vitals  Encounter Vitals Group     BP 08/01/23 0941 134/74     Systolic BP Percentile --      Diastolic BP Percentile --      Pulse Rate 08/01/23 0941 (!) 55     Resp 08/01/23 0941 18     Temp 08/01/23 0941 98.1 F (36.7 C)     Temp Source 08/01/23 0941 Oral     SpO2 08/01/23 0941 97 %     Weight --      Height --      Head Circumference --      Peak Flow --      Pain Score 08/01/23 0946 0     Pain Loc --      Pain Education --      Exclude from Growth Chart --    No data found.  Updated Vital Signs BP 134/74 (BP Location: Right Arm)   Pulse (!) 55   Temp 98.1 F (36.7 C) (Oral)   Resp 18   SpO2 97%   Visual Acuity Right Eye Distance:   Left Eye Distance:   Bilateral Distance:    Right Eye Near:   Left Eye Near:    Bilateral Near:     Physical Exam Vitals and nursing note reviewed.  Constitutional:      Appearance: Normal appearance. She is not ill-appearing.  HENT:     Head: Atraumatic.  Eyes:     Extraocular Movements: Extraocular movements intact.     Conjunctiva/sclera: Conjunctivae normal.  Cardiovascular:     Rate and Rhythm: Normal rate.  Pulmonary:     Effort: Pulmonary effort is normal.     Breath sounds: Normal breath sounds.  Musculoskeletal:        General: Normal range of motion.     Cervical back: Normal range of motion and neck supple.  Skin:    General: Skin is warm and dry.     Findings:  Rash present.     Comments: Edematous very slightly raised maculopapular rash widespread across trunk, upper legs, back  Neurological:     Mental Status: She is alert and oriented to person, place, and time.  Psychiatric:        Mood and Affect: Mood normal.  Thought Content: Thought content normal.        Judgment: Judgment normal.      UC Treatments / Results  Labs (all labs ordered are listed, but only abnormal results are displayed) Labs Reviewed - No data to display  EKG   Radiology No results found.  Procedures Procedures (including critical care time)  Medications Ordered in UC Medications  methylPREDNISolone  sodium succinate (SOLU-MEDROL ) 125 mg/2 mL injection 125 mg (125 mg Intramuscular Given 08/01/23 1036)    Initial Impression / Assessment and Plan / UC Course  I have reviewed the triage vital signs and the nursing notes.  Pertinent labs & imaging results that were available during my care of the patient were reviewed by me and considered in my medical decision making (see chart for details).     Unclear etiology of rash, will treat with IM Solu-Medrol , triamcinolone  cream, antihistamines, supportive home care.  Turn for worsening symptoms.  Final Clinical Impressions(s) / UC Diagnoses   Final diagnoses:  Irritant dermatitis   Discharge Instructions   None    ED Prescriptions     Medication Sig Dispense Auth. Provider   triamcinolone  cream (KENALOG ) 0.1 % Apply 1 Application topically 2 (two) times daily. Avoid use on face or private areas 80 g Corbin Dess, [place]      PDMP not reviewed this encounter.   Corbin Dess, [place] 08/01/23 1103

## 2023-08-02 ENCOUNTER — Encounter: Payer: Self-pay | Admitting: Family Medicine

## 2023-09-28 ENCOUNTER — Other Ambulatory Visit: Payer: Self-pay

## 2023-10-11 ENCOUNTER — Other Ambulatory Visit (HOSPITAL_COMMUNITY): Payer: Self-pay

## 2023-10-28 ENCOUNTER — Other Ambulatory Visit (INDEPENDENT_AMBULATORY_CARE_PROVIDER_SITE_OTHER): Payer: Self-pay | Admitting: Gastroenterology

## 2023-10-28 ENCOUNTER — Other Ambulatory Visit: Payer: Self-pay

## 2023-10-28 ENCOUNTER — Other Ambulatory Visit (HOSPITAL_COMMUNITY): Payer: Self-pay

## 2023-10-28 DIAGNOSIS — K5904 Chronic idiopathic constipation: Secondary | ICD-10-CM

## 2023-10-28 MED ORDER — PANTOPRAZOLE SODIUM 40 MG PO TBEC
40.0000 mg | DELAYED_RELEASE_TABLET | Freq: Every day | ORAL | 3 refills | Status: AC
  Filled 2023-10-28: qty 60, 60d supply, fill #0
  Filled 2023-12-24: qty 60, 60d supply, fill #1
  Filled 2024-02-22: qty 60, 60d supply, fill #2
  Filled 2024-04-22: qty 60, 60d supply, fill #3

## 2023-10-29 ENCOUNTER — Encounter (HOSPITAL_COMMUNITY): Payer: Self-pay

## 2023-10-29 ENCOUNTER — Other Ambulatory Visit (HOSPITAL_COMMUNITY): Payer: Self-pay

## 2023-12-24 ENCOUNTER — Other Ambulatory Visit (HOSPITAL_COMMUNITY): Payer: Self-pay

## 2024-01-25 ENCOUNTER — Other Ambulatory Visit: Payer: Self-pay

## 2024-01-25 ENCOUNTER — Other Ambulatory Visit: Payer: Self-pay | Admitting: Family Medicine

## 2024-01-25 ENCOUNTER — Other Ambulatory Visit (HOSPITAL_COMMUNITY): Payer: Self-pay

## 2024-01-25 MED ORDER — AMLODIPINE BESYLATE 2.5 MG PO TABS
2.5000 mg | ORAL_TABLET | Freq: Every day | ORAL | 1 refills | Status: DC
  Filled 2024-01-25: qty 90, 90d supply, fill #0

## 2024-02-07 ENCOUNTER — Other Ambulatory Visit (HOSPITAL_COMMUNITY): Payer: Self-pay | Admitting: Obstetrics and Gynecology

## 2024-02-07 DIAGNOSIS — Z1231 Encounter for screening mammogram for malignant neoplasm of breast: Secondary | ICD-10-CM

## 2024-02-16 ENCOUNTER — Ambulatory Visit: Payer: Commercial Managed Care - PPO

## 2024-02-16 ENCOUNTER — Other Ambulatory Visit (HOSPITAL_COMMUNITY): Payer: Self-pay

## 2024-02-16 VITALS — BP 126/65 | HR 64 | Ht 62.0 in | Wt 139.0 lb

## 2024-02-16 DIAGNOSIS — R7303 Prediabetes: Secondary | ICD-10-CM

## 2024-02-16 DIAGNOSIS — E559 Vitamin D deficiency, unspecified: Secondary | ICD-10-CM

## 2024-02-16 DIAGNOSIS — E782 Mixed hyperlipidemia: Secondary | ICD-10-CM

## 2024-02-16 DIAGNOSIS — E538 Deficiency of other specified B group vitamins: Secondary | ICD-10-CM

## 2024-02-16 DIAGNOSIS — I10 Essential (primary) hypertension: Secondary | ICD-10-CM | POA: Diagnosis not present

## 2024-02-16 MED ORDER — AMLODIPINE BESYLATE 2.5 MG PO TABS
2.5000 mg | ORAL_TABLET | Freq: Every day | ORAL | 3 refills | Status: AC
  Filled 2024-02-16 – 2024-04-22 (×2): qty 90, 90d supply, fill #0

## 2024-02-16 NOTE — Progress Notes (Signed)
 Established Patient Office Visit  Subjective   Patient ID: Heather Arnold, female    DOB: Aug 17, 1960  Age: 63 y.o. MRN: [id number]  Chief Complaint  Patient presents with   Medical Management of Chronic Issues    Pt here for routine labs     HPI Discussed the use of AI scribe software for clinical note transcription with the patient, who gave verbal consent to proceed.  History of Present Illness    Heather Arnold is a 63 year old female with hypertension who presents for routine lab work and blood pressure follow-up.  Hypertension - Currently taking amlodipine  for blood pressure control - Most recent blood pressure recorded at 134/74 mmHg - Current supply of amlodipine  is running low and may require prescription refill  Elevated hemoglobin a1c - A1c was elevated at last visit, raising concerns for hyperglycemia     Patient Active Problem List   Diagnosis Date Noted   Prediabetes 02/18/2024   Vitamin B12 deficiency 02/18/2024   Vitamin D  deficiency 02/18/2024   Mixed hyperlipidemia 02/18/2024   Gastritis and gastroduodenitis 03/15/2023   Gastric polyp 03/15/2023   Noninfectious gastroenteritis 03/15/2023   Esophageal dysphagia 03/08/2023   Chronic idiopathic constipation 03/08/2023   Positive colorectal cancer screening using Cologuard test 03/08/2023   Primary hypertension 03/08/2023   Elevated blood pressure reading 02/12/2023   Thyroid  nodule 02/12/2023   Kidney stones 10/16/2019   Hypokalemia 01/16/2011   Bradycardia 01/16/2011   Chest pain 01/15/2011   Vomiting 01/15/2011   Heartburn 01/15/2011      ROS    Objective:     BP 126/65   Pulse 64   Ht 5' 2 (1.575 m)   Wt 139 lb 0.6 oz (63.1 kg)   SpO2 97%   BMI 25.43 kg/m  BP Readings from Last 3 Encounters:  02/16/24 126/65  08/01/23 134/74  07/30/23 132/72   Wt Readings from Last 3 Encounters:  02/16/24 139 lb 0.6 oz (63.1 kg)  07/30/23 131 lb 1.9 oz (59.5 kg)  03/08/23 130 lb 1.6 oz (59 kg)       Physical Exam Vitals and nursing note reviewed.  Constitutional:      Appearance: Normal appearance.  HENT:     Head: Normocephalic.     Right Ear: Tympanic membrane, ear canal and external ear normal.     Left Ear: Tympanic membrane, ear canal and external ear normal.     Nose: Nose normal.     Mouth/Throat:     Mouth: Mucous membranes are moist.     Pharynx: Oropharynx is clear.  Eyes:     Extraocular Movements: Extraocular movements intact.     Pupils: Pupils are equal, round, and reactive to light.  Cardiovascular:     Rate and Rhythm: Normal rate and regular rhythm.  Pulmonary:     Effort: Pulmonary effort is normal.     Breath sounds: Normal breath sounds.  Abdominal:     General: Abdomen is flat. Bowel sounds are normal.     Palpations: Abdomen is soft.  Musculoskeletal:     Cervical back: Normal range of motion and neck supple.  Skin:    General: Skin is warm and dry.  Neurological:     Mental Status: She is alert and oriented to person, place, and time.  Psychiatric:        Mood and Affect: Mood normal.        Thought Content: Thought content normal.     Last CBC  Lab Results  Component Value Date   WBC 6.6 02/16/2024   HGB 13.0 02/16/2024   HCT 40.1 02/16/2024   MCV 91 02/16/2024   MCH 29.6 02/16/2024   RDW 13.1 02/16/2024   PLT 348 02/16/2024   Last metabolic panel Lab Results  Component Value Date   GLUCOSE 110 (H) 02/16/2024   NA 141 02/16/2024   K 4.0 02/16/2024   CL 103 02/16/2024   CO2 24 02/16/2024   BUN 12 02/16/2024   CREATININE 0.69 02/16/2024   EGFR 97 02/16/2024   CALCIUM 9.4 02/16/2024   PROT 6.8 02/16/2024   ALBUMIN 4.4 02/16/2024   LABGLOB 2.4 02/16/2024   BILITOT 0.6 02/16/2024   ALKPHOS 103 02/16/2024   AST 21 02/16/2024   ALT 15 02/16/2024   ANIONGAP 12 10/07/2019   Last lipids Lab Results  Component Value Date   CHOL 204 (H) 02/16/2024   HDL 57 02/16/2024   LDLCALC 133 (H) 02/16/2024   TRIG 80 02/16/2024    CHOLHDL 3.6 02/16/2024   Last hemoglobin A1c Lab Results  Component Value Date   HGBA1C 5.9 (H) 02/16/2024   Last thyroid  functions Lab Results  Component Value Date   TSH 1.260 02/15/2023   FREET4 1.05 02/15/2023   Last vitamin D  Lab Results  Component Value Date   VD25OH 29.4 (L) 02/15/2023   Last vitamin B12 and Folate Lab Results  Component Value Date   VITAMINB12 426 02/16/2024      The 10-year ASCVD risk score (Arnett DK, et al., 2019) is: 6.5%    Assessment & Plan:   Problem List Items Addressed This Visit       Cardiovascular and Mediastinum   Primary hypertension   Blood pressure well-controlled with current medication.  Amlodipine  2.5 mg renewed.      Relevant Medications   amLODipine  (NORVASC ) 2.5 MG tablet   Other Relevant Orders   Lipid Profile (Completed)   CBC with Differential/Platelet (Completed)     Other   Prediabetes - Primary   Previous A1c indicated prediabetes.  Recheck A1c level.      Relevant Orders   Hemoglobin A1C (Completed)   CMP14+EGFR (Completed)   Vitamin B12 deficiency   Recheck B12 level.      Relevant Orders   B12 (Completed)   Vitamin D  deficiency   Recheck vitamin D  level.      Relevant Orders   VITAMIN D  25 Hydroxy (Vit-D Deficiency, Fractures)   Mixed hyperlipidemia   Check fasting labs today.      Relevant Medications   amLODipine  (NORVASC ) 2.5 MG tablet   Other Relevant Orders   Lipid Profile (Completed)   CMP14+EGFR (Completed)    Return in about 6 months (around 08/15/2024) for chronic follow-up with PCP.    Leita Longs, FNP

## 2024-02-17 LAB — CBC WITH DIFFERENTIAL/PLATELET
Basophils Absolute: 0.1 x10E3/uL (ref 0.0–0.2)
Basos: 1 %
EOS (ABSOLUTE): 0.2 x10E3/uL (ref 0.0–0.4)
Eos: 3 %
Hematocrit: 40.1 % (ref 34.0–46.6)
Hemoglobin: 13 g/dL (ref 11.1–15.9)
Immature Grans (Abs): 0 x10E3/uL (ref 0.0–0.1)
Immature Granulocytes: 0 %
Lymphocytes Absolute: 2.3 x10E3/uL (ref 0.7–3.1)
Lymphs: 36 %
MCH: 29.6 pg (ref 26.6–33.0)
MCHC: 32.4 g/dL (ref 31.5–35.7)
MCV: 91 fL (ref 79–97)
Monocytes Absolute: 0.4 x10E3/uL (ref 0.1–0.9)
Monocytes: 7 %
Neutrophils Absolute: 3.5 x10E3/uL (ref 1.4–7.0)
Neutrophils: 53 %
Platelets: 348 x10E3/uL (ref 150–450)
RBC: 4.39 x10E6/uL (ref 3.77–5.28)
RDW: 13.1 % (ref 11.7–15.4)
WBC: 6.6 x10E3/uL (ref 3.4–10.8)

## 2024-02-17 LAB — CMP14+EGFR
ALT: 15 IU/L (ref 0–32)
AST: 21 IU/L (ref 0–40)
Albumin: 4.4 g/dL (ref 3.9–4.9)
Alkaline Phosphatase: 103 IU/L (ref 49–135)
BUN/Creatinine Ratio: 17 (ref 12–28)
BUN: 12 mg/dL (ref 8–27)
Bilirubin Total: 0.6 mg/dL (ref 0.0–1.2)
CO2: 24 mmol/L (ref 20–29)
Calcium: 9.4 mg/dL (ref 8.7–10.3)
Chloride: 103 mmol/L (ref 96–106)
Creatinine, Ser: 0.69 mg/dL (ref 0.57–1.00)
Globulin, Total: 2.4 g/dL (ref 1.5–4.5)
Glucose: 110 mg/dL — ABNORMAL HIGH (ref 70–99)
Potassium: 4 mmol/L (ref 3.5–5.2)
Sodium: 141 mmol/L (ref 134–144)
Total Protein: 6.8 g/dL (ref 6.0–8.5)
eGFR: 97 mL/min/1.73

## 2024-02-17 LAB — VITAMIN B12: Vitamin B-12: 426 pg/mL (ref 232–1245)

## 2024-02-17 LAB — LIPID PANEL
Chol/HDL Ratio: 3.6 ratio (ref 0.0–4.4)
Cholesterol, Total: 204 mg/dL — ABNORMAL HIGH (ref 100–199)
HDL: 57 mg/dL (ref >39)
LDL Chol Calc (NIH): 133 mg/dL — ABNORMAL HIGH (ref 0–99)
Triglycerides: 80 mg/dL (ref 0–149)
VLDL Cholesterol Cal: 14 mg/dL (ref 5–40)

## 2024-02-17 LAB — HEMOGLOBIN A1C
Est. average glucose Bld gHb Est-mCnc: 123 mg/dL
Hgb A1c MFr Bld: 5.9 % — ABNORMAL HIGH (ref 4.8–5.6)

## 2024-02-18 ENCOUNTER — Ambulatory Visit: Payer: Self-pay

## 2024-02-18 DIAGNOSIS — E538 Deficiency of other specified B group vitamins: Secondary | ICD-10-CM | POA: Insufficient documentation

## 2024-02-18 DIAGNOSIS — R7303 Prediabetes: Secondary | ICD-10-CM | POA: Insufficient documentation

## 2024-02-18 DIAGNOSIS — E782 Mixed hyperlipidemia: Secondary | ICD-10-CM | POA: Insufficient documentation

## 2024-02-18 DIAGNOSIS — E559 Vitamin D deficiency, unspecified: Secondary | ICD-10-CM | POA: Insufficient documentation

## 2024-02-18 NOTE — Assessment & Plan Note (Signed)
Check fasting labs today. 

## 2024-02-18 NOTE — Assessment & Plan Note (Signed)
 Recheck vitamin D  level

## 2024-02-18 NOTE — Assessment & Plan Note (Signed)
Recheck B12 level 

## 2024-02-18 NOTE — Assessment & Plan Note (Signed)
 Previous A1c indicated prediabetes.  Recheck A1c level.

## 2024-02-18 NOTE — Assessment & Plan Note (Signed)
 Blood pressure well-controlled with current medication.  Amlodipine  2.5 mg renewed.

## 2024-02-22 ENCOUNTER — Other Ambulatory Visit (HOSPITAL_COMMUNITY): Payer: Self-pay

## 2024-03-03 ENCOUNTER — Inpatient Hospital Stay (HOSPITAL_COMMUNITY)
Admission: RE | Admit: 2024-03-03 | Discharge: 2024-03-03 | Attending: Obstetrics and Gynecology | Admitting: Obstetrics and Gynecology

## 2024-03-03 DIAGNOSIS — Z1231 Encounter for screening mammogram for malignant neoplasm of breast: Secondary | ICD-10-CM

## 2024-04-22 ENCOUNTER — Other Ambulatory Visit (HOSPITAL_COMMUNITY): Payer: Self-pay

## 2024-04-23 ENCOUNTER — Other Ambulatory Visit (HOSPITAL_COMMUNITY): Payer: Self-pay

## 2024-08-16 ENCOUNTER — Ambulatory Visit
